# Patient Record
Sex: Male | Born: 1995 | Race: White | Hispanic: No | Marital: Single
Health system: Southern US, Community
[De-identification: ages and names within clinical notes are randomized; demographics above are authoritative.]

## PROBLEM LIST (undated history)

## (undated) DIAGNOSIS — B019 Varicella without complication: Secondary | ICD-10-CM

## (undated) DIAGNOSIS — M303 Mucocutaneous lymph node syndrome [Kawasaki]: Secondary | ICD-10-CM

## (undated) DIAGNOSIS — R55 Syncope and collapse: Secondary | ICD-10-CM

## (undated) HISTORY — DX: Mucocutaneous lymph node syndrome (kawasaki): M30.3

## (undated) HISTORY — DX: Syncope and collapse: R55

## (undated) HISTORY — DX: Varicella without complication: B01.9

---
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
## 2015-09-29 ENCOUNTER — Encounter: Payer: Self-pay | Admitting: Family Medicine

## 2015-09-29 ENCOUNTER — Ambulatory Visit (INDEPENDENT_AMBULATORY_CARE_PROVIDER_SITE_OTHER): Payer: 59 | Admitting: Family Medicine

## 2015-09-29 VITALS — BP 126/89 | HR 62 | Temp 98.4°F | Ht 71.5 in | Wt 138.5 lb

## 2015-09-29 DIAGNOSIS — Z0000 Encounter for general adult medical examination without abnormal findings: Secondary | ICD-10-CM | POA: Diagnosis not present

## 2015-09-29 NOTE — Patient Instructions (Signed)
Get flu vaccine when it is ready.

## 2015-09-29 NOTE — Progress Notes (Signed)
The patient is here for annual wellness exam and preventative care.    Going to [employer]. Living on campus. Changed to PA track instead pharmacy. Has some scholarships.  Diet; healthy. Exercise:  several times a week. Has been drinking protein shakes. Eats three meals a day, snacks occ. Body mass index is 19.05 kg/m.   No issues sleeping.   No concern about self image. No depression.   Social History /Family History/Past Medical History reviewed and updated if needed.   Review of Systems  Constitutional: Negative for fatigue.  HENT: Negative for ear pain.   Eyes: Negative for pain.  Respiratory: Negative for cough and shortness of breath.   Cardiovascular: Negative for chest pain and leg swelling.  Gastrointestinal: Negative for nausea, abdominal pain, diarrhea, constipation and blood in stool.  Genitourinary: Negative for dysuria, urgency, penile pain and testicular pain.  Musculoskeletal: Negative for back pain.  Psychiatric/Behavioral: Negative for dysphoric mood. The patient is not nervous/anxious.        Objective:   Physical Exam  Constitutional: He appears well-developed and well-nourished.  Non-toxic appearance. He does not appear ill. No distress.  HENT:  Head: Normocephalic and atraumatic.  Right Ear: Hearing, tympanic membrane, external ear and ear canal normal.  Left Ear: Hearing, tympanic membrane, external ear and ear canal normal.  Nose: Nose normal.  Mouth/Throat: Uvula is midline, oropharynx is clear and moist and mucous membranes are normal.  Eyes: Conjunctivae, EOM and lids are normal. Pupils are equal, round, and reactive to light. Lids are everted and swept, no foreign bodies found.  Neck: Trachea normal, normal range of motion and phonation normal. Neck supple. Carotid bruit is not present. No thyroid mass and no thyromegaly present.  Cardiovascular: Normal rate, regular rhythm, S1 normal, S2 normal, intact distal pulses and normal pulses.  Exam  reveals no gallop.   No murmur heard. Pulmonary/Chest: Breath sounds normal. He has no wheezes. He has no rhonchi. He has no rales.  Abdominal: Soft. Normal appearance and bowel sounds are normal. There is no hepatosplenomegaly. There is no tenderness. There is no rebound, no guarding and no CVA tenderness. No hernia. Hernia confirmed negative in the right inguinal area and confirmed negative in the left inguinal area.  Genitourinary: No evaluated.   He has no cervical adenopathy.       Right: No inguinal adenopathy present.       Left: No inguinal adenopathy present.  Neurological: He is alert. He has normal strength and normal reflexes. No cranial nerve deficit or sensory deficit. Gait normal.  Skin: Skin is warm, dry and intact. No rash noted.  Psychiatric: He has a normal mood and affect. His speech is normal and behavior is normal. Judgment normal.        Assessment & Plan:  The patient's preventative maintenance and recommended screening tests for an annual wellness exam were reviewed in full today. Brought up to date unless services declined.  Counselled on the importance of diet, exercise, and its role in overall health and mortality. The patient's FH and SH was reviewed, including their home life, tobacco status, and drug and alcohol status.   Vaccines: Meningitis vaccine 2013, uptodate with college vaccines.  Last TDap 2009, due for flu, will get flu when available. Nonsmoker Drugs : None ETOH: None Sex: has been active in past,  Not more than 5 partners in last. STD screen: No known exposure. He refuses screening.  Uses condom.

## 2015-09-29 NOTE — Progress Notes (Signed)
Pre visit review using our clinic review tool, if applicable. No additional management support is needed unless otherwise documented below in the visit note. 

## 2016-07-03 ENCOUNTER — Ambulatory Visit (INDEPENDENT_AMBULATORY_CARE_PROVIDER_SITE_OTHER): Payer: 59 | Admitting: Internal Medicine

## 2016-07-03 ENCOUNTER — Ambulatory Visit: Payer: 59 | Admitting: Family Medicine

## 2016-07-03 ENCOUNTER — Encounter: Payer: Self-pay | Admitting: Internal Medicine

## 2016-07-03 VITALS — BP 124/78 | HR 102 | Temp 101.6°F | Wt 132.8 lb

## 2016-07-03 DIAGNOSIS — J020 Streptococcal pharyngitis: Secondary | ICD-10-CM

## 2016-07-03 DIAGNOSIS — R509 Fever, unspecified: Secondary | ICD-10-CM

## 2016-07-03 DIAGNOSIS — J029 Acute pharyngitis, unspecified: Secondary | ICD-10-CM

## 2016-07-03 MED ORDER — AMOXICILLIN 500 MG PO CAPS: 500.0000 mg | ORAL_CAPSULE | Freq: Three times a day (TID) | ORAL | 0 refills | Status: DC

## 2016-07-03 NOTE — Patient Instructions (Signed)

## 2016-07-03 NOTE — Progress Notes (Signed)
Subjective:    Patient ID: Andrew Gregory, male    DOB: 07-Sep-1995, 21 y.o.   MRN: [id number]  HPI  Pt presents to the clinic today with c/o sore throat, fever, chills and body aches. He reports this started 3 days ago. He is having some difficulty swallowing. He has some mild ear pressure but denies pain or decreased hearing. He has run fevers up to 102.0. He has tried Thera Flu with minimal relief. He has no history of allergies. He has had sick contacts with similar symptoms.   Review of Systems      Past Medical History:  Diagnosis Date  . Chicken pox   . Fainting spell   . Kawasaki disease (HCC) 2000    No current outpatient prescriptions on file.   No current facility-administered medications for this visit.     No Known Allergies  Family History  Problem Relation Age of Onset  . Cancer Maternal Grandmother 21       stomach cancer  . Hypertension Maternal Grandfather   . Heart disease Paternal Grandmother 58       stent placed  . Hyperlipidemia Paternal Grandfather   . Hypertension Paternal Grandfather     Social History   Social History  . Marital status: Single    Spouse name: N/A  . Number of children: N/A  . Years of education: N/A   Occupational History  . Not on file.   Social History Main Topics  . Smoking status: Never Smoker  . Smokeless tobacco: Never Used  . Alcohol use No  . Drug use: No  . Sexual activity: Not Currently   Other Topics Concern  . Not on file   Social History Narrative   Daily exercise: wrestling, P90X, running   Diet: healthy     Constitutional: Pt reports fever, chills and body aches. Denies headache or abrupt weight changes.  HEENT: Pt reports sore throat. Denies eye pain, eye redness, ear pain, ringing in the ears, wax buildup, runny nose, nasal congestion, bloody noset. Respiratory: Denies difficulty breathing, shortness of breath, cough or sputum production.    No other specific complaints in a complete review  of systems (except as listed in HPI above).  Objective:   Physical Exam BP 124/78   Pulse (!) 102   Temp (!) 101.6 F (38.7 C) (Oral)   Wt 132 lb 12 oz (60.2 kg)   SpO2 97%   BMI 18.26 kg/m  Wt Readings from Last 3 Encounters:  07/03/16 132 lb 12 oz (60.2 kg)  09/29/15 138 lb 8 oz (62.8 kg) (23 %, Z= -0.75)*  09/23/14 132 lb 8 oz (60.1 kg) (18 %, Z= -0.91)*   * Growth percentiles are based on CDC 2-20 Years data.    General: Appears his stated age, ill appearing, in NAD. Skin: No rashes noted. HEENT: Head: normal shape and size, no sinus tenderness noted; Ears: Tm's gray and intact, normal light reflex;  Throat/Mouth: Teeth present, mucosa erythematous and moist, white patchy exudate noted on bilateral tonsillar pillars.  Neck:  Bilateral anterior cervical adenopathy noted. Cardiovascular: Tachycardic with normal rhythm. S1,S2 noted.   Pulmonary/Chest: Normal effort and positive vesicular breath sounds. No respiratory distress. No wheezes, rales or ronchi noted.    BMET No results found for: NA, K, CL, CO2, GLUCOSE, BUN, CREATININE, CALCIUM, GFRNONAA, GFRAA  Lipid Panel     Component Value Date/Time   CHOL 159 11/22/2011   TRIG 46 11/22/2011   HDL 65 11/22/2011  LDLCALC 85 11/22/2011    CBC No results found for: WBC, RBC, HGB, HCT, PLT, MCV, MCH, MCHC, RDW, LYMPHSABS, MONOABS, EOSABS, BASOSABS  Hgb A1C No results found for: HGBA1C           Assessment & Plan:   Strep Throat:  RST: negative Will send throat culture Salt water gargles as needed Ibuprofen as needed for pain and swelling eRx for Amoxil TID x 10 days  RTC as needed or if symptoms persist or worsen Akemi Overholser, NP

## 2016-07-03 NOTE — Addendum Note (Signed)
Addended by: Littie DeedsEVONTENNO, Tyqwan Pink Y on: 07/03/2016 03:00 PM   Modules accepted: Orders

## 2016-07-06 LAB — CULTURE, GROUP A STREP: Organism ID, Bacteria: NORMAL

## 2016-11-05 DIAGNOSIS — N342 Other urethritis: Secondary | ICD-10-CM | POA: Diagnosis not present

## 2016-11-26 DIAGNOSIS — Z23 Encounter for immunization: Secondary | ICD-10-CM | POA: Diagnosis not present

## 2018-07-24 ENCOUNTER — Encounter: Payer: Self-pay | Admitting: Family Medicine

## 2018-07-24 ENCOUNTER — Ambulatory Visit (INDEPENDENT_AMBULATORY_CARE_PROVIDER_SITE_OTHER): Payer: 59 | Admitting: Family Medicine

## 2018-07-24 ENCOUNTER — Telehealth: Payer: Self-pay | Admitting: General Practice

## 2018-07-24 DIAGNOSIS — Z20828 Contact with and (suspected) exposure to other viral communicable diseases: Secondary | ICD-10-CM | POA: Diagnosis not present

## 2018-07-24 DIAGNOSIS — Z20822 Contact with and (suspected) exposure to covid-19: Secondary | ICD-10-CM | POA: Insufficient documentation

## 2018-07-24 NOTE — Addendum Note (Signed)
Addended by: Dimple Nanas on: 07/24/2018 05:12 PM   Modules accepted: Orders

## 2018-07-24 NOTE — Telephone Encounter (Signed)
Called pt, lvm on mobile to return call to schedule Covid testing.

## 2018-07-24 NOTE — Assessment & Plan Note (Addendum)
Exposure to known positive pt, < 6 feet and no mask  Asymptomatic.. no indication for home monitoring at this time. Set up testing, home isolation, info sent to pt via MyChart on isolation, social distancing and management.

## 2018-07-24 NOTE — Progress Notes (Signed)
VIRTUAL VISIT Due to national recommendations of social distancing due to [place] 19, a virtual visit is felt to be most appropriate for this patient at this time.   I connected with the patient on 07/24/18 at  3:00 PM EDT by virtual telehealth platform and verified that I am speaking with the correct person using two identifiers.   I discussed the limitations, risks, security and privacy concerns of performing an evaluation and management service by  virtual telehealth platform and the availability of in person appointments. I also discussed with the patient that there may be a patient responsible charge related to this service. The patient expressed understanding and agreed to proceed.  Patient location: Home Provider Location: [place] Participants: Eliezer Lofts and [facility]   Chief Complaint  Patient presents with  . Covid [place]    History of Present Illness: 23 year old male presents for concerns of covid19 exposure   He reports he has been in contact with cousin without masks off and on in several weeks.  Not wearing a mask. Last around her 4 days ago  She has fever and has tested positive for covid 19 today.   No fever, no sob, cough, congestion, N/V/D, loss of taste and smell   COVID 19 screen No recent travel or known exposure to COVID19 The patient denies respiratory symptoms of COVID 19 at this time.  The importance of social distancing was discussed today.   Review of Systems  Constitutional: Negative for chills and fever.  HENT: Negative for congestion and ear pain.   Eyes: Negative for pain and redness.  Respiratory: Negative for cough and shortness of breath.   Cardiovascular: Negative for chest pain, palpitations and leg swelling.  Gastrointestinal: Negative for abdominal pain, blood in stool, constipation, diarrhea, nausea and vomiting.  Genitourinary: Negative for dysuria.  Musculoskeletal: Negative for falls and myalgias.  Skin: Negative for  rash.  Neurological: Negative for dizziness.  Psychiatric/Behavioral: Negative for depression. The patient is not nervous/anxious.       Past Medical History:  Diagnosis Date  . Chicken pox   . Fainting spell   . Kawasaki disease ([place]) 2000    reports that he has never smoked. He has never used smokeless tobacco. He reports that he does not drink alcohol or use drugs.  No current outpatient medications on file.   Observations/Objective: Height 6\' 1"  (1.854 m), weight 138 lb 6 oz (62.8 kg).  Physical Exam  Physical Exam Constitutional:      General: The patient is not in acute distress. Pulmonary:     Effort: Pulmonary effort is normal. No respiratory distress.  Neurological:     Mental Status: The patient is alert and oriented to person, place, and time.  Psychiatric:        Mood and Affect: Mood normal.        Behavior: Behavior normal.   Assessment and Plan   Exposure to Covid-19 Virus Exposure to known positive pt, < 6 feet and no mask  Asymptomatic.. no indication for home monitoring at this time. Set up testing, home isolation, info sent to pt via MyChart on isolation, social distancing and management.   I discussed the assessment and treatment plan with the patient. The patient was provided an opportunity to ask questions and all were answered. The patient agreed with the plan and demonstrated an understanding of the instructions.   The patient was advised to call back or seek an in-person evaluation if the symptoms worsen  or if the condition fails to improve as anticipated.     Kerby NoraAmy , MD

## 2018-07-24 NOTE — Telephone Encounter (Signed)
-----   Message from [name], MD sent at 07/24/2018  4:22 PM EDT ----- Andrew Gregory  DOB [birth year] MRN:  [id number]  Asymptomatic, < 6 feet, no mask close exposure to known covid 19 positive patient in last 4 days [place] [phone]

## 2018-07-24 NOTE — Patient Instructions (Signed)
How to care for yourself at home:   1) Drink plenty of fluids 2) Get lots of rest 3) Wash your hands regularly - for 20 seconds 4) Cover your mouth when you cough -- ideally cough into your elbow 5) Take tylenol - can do up to 1000 mg every 8 hours (or do smaller doses more frequently) - Avoid Ibuprofen if able 6) Stay home - avoid contact with other people. Ideally have someone bring your groceries, etc.  7) Avoid touching your eyes, nose, mouth 8) Over the counter cold medication may be helpful  You leave home again - when ALL the following are TRUE:  1) No fever for at least 72 hours (without taking any medication) 2) Other symptoms have improved - no longer with cough or shortness of breath 3) At least 7 days since you got sick  Call the clinic immediately or consider going to the emergency room if:  1) Trouble Breathing 2) Persistent pain or pressure in the chest 3) New confusion or inability to wake 4) Bluish lips or face 5) Notify them that you may have COVID-19  For close contacts (people in your home)  1) Help the person you are with stay home - grocery, pharmacy, etc 2) Help monitor their symptoms for worsening illness 3) Ideally sleep in a separate room and try to use separate bathroom if possible 4) Try to limit care giver to ONE person - all others (including pets) should avoid contact 5) Clean surfaces often and wash laundry often 6) Monitor yourself for symptoms. Make sure you contact your health care provider and avoid work as soon as you develop symptoms 7) Ideally would recommend working from home or not going to work if able.    Medications:  You may have heard about medications currently being used to treat COVID-19 - including Remdesivir and Hydroxychloroquine/Chloroquine. Currently there are no FDA approved medications to treat COVID-19 and these medicines are only being used as part of a clinical trials (still studying the effect). They are only being used in  hospitalized patients because they come with significant risks.   The only proven treatment is symptomatic care - rest, fluids, tylenol.    Below is more detailed information from the [place] [facility]  Individuals who are confirmed to have, or are being evaluated for, COVID-19 should follow the prevention steps below until a healthcare provider or local or state health department says they can return to normal activities.  Stay home except to get medical care You should restrict activities outside your home, except for getting medical care. Do not go to work, school, or public areas, and do not use public transportation or taxis.  Call ahead before visiting your doctor Before your medical appointment, call the healthcare provider and tell them that you are being evaluated for, COVID-19 infection.  Monitor your symptoms Seek prompt medical attention if your illness is worsening (e.g., difficulty breathing).   Wear a facemask You should wear a facemask that covers your nose and mouth when you are in the same room with other people and when you visit a healthcare provider.   Separate yourself from other people in your home As much as possible, you should stay in a different room from other people in your home. Also, you should use a separate bathroom, if available.  Avoid sharing household items You should not share dishes, drinking glasses, cups, eating utensils, towels, bedding, or other items with other people in your home. After using these   items, you should wash them thoroughly with soap and water.  Cover your coughs and sneezes Cover your mouth and nose with a tissue when you cough or sneeze, or you can cough or sneeze into your sleeve. Throw used tissues in a lined trash can, and immediately wash your hands with soap and water for at least 20 seconds or use an alcohol-based hand rub.  Wash your hands Wash your hands often and thoroughly with soap and water for at least  20 seconds. You can use an alcohol-based hand sanitizer if soap and water are not available and if your hands are not visibly dirty. Avoid touching your eyes, nose, and mouth with unwashed hands.   Prevention Steps for Caregivers and Household Members of Individuals Confirmed to have, or Being Evaluated for, COVID-19 Infection Being Cared for in the Home  If you live with, or provide care at home for, a person confirmed to have, or being evaluated for, COVID-19 infection please follow these guidelines to prevent infection:  Follow healthcare provider's instructions Make sure that you understand and can help the patient follow any healthcare provider instructions for all care.  Provide for the patient's basic needs You should help the patient with basic needs in the home and provide support for getting groceries, prescriptions, and other personal needs.  Monitor the patient's symptoms If they are getting sicker, call his or her medical provider.   Limit the number of people who have contact with the patient  If possible, have only one caregiver for the patient.  Other household members should stay in another home or place of residence. If this is not possible, they should stay  in another room, or be separated from the patient as much as possible. Use a separate bathroom, if available.  Restrict visitors who do not have an essential need to be in the home.  Keep older adults, very young children, and other sick people away from the patient Keep older adults, very young children, and those who have compromised immune systems or chronic health conditions away from the patient. This includes people with chronic heart, lung, or kidney conditions, diabetes, and cancer.  Ensure good ventilation Make sure that shared spaces in the home have good air flow, such as from an air conditioner or an opened window, weather permitting.  Wash your hands often  Wash your hands often and  thoroughly with soap and water for at least 20 seconds. You can use an alcohol based hand sanitizer if soap and water are not available and if your hands are not visibly dirty.  Avoid touching your eyes, nose, and mouth with unwashed hands.  Use disposable paper towels to dry your hands. If not available, use dedicated cloth towels and replace them when they become wet.  Wear a facemask and gloves  Wear a disposable facemask at all times in the room and gloves when you touch or have contact with the patient's blood, body fluids, and/or secretions or excretions, such as sweat, saliva, sputum, nasal mucus, vomit, urine, or feces.  Ensure the mask fits over your nose and mouth tightly, and do not touch it during use.  Throw out disposable facemasks and gloves after using them. Do not reuse.  Wash your hands immediately after removing your facemask and gloves.  If your personal clothing becomes contaminated, carefully remove clothing and launder. Wash your hands after handling contaminated clothing.  Place all used disposable facemasks, gloves, and other waste in a lined container before disposing them   with other household waste.  Remove gloves and wash your hands immediately after handling these items.  Do not share dishes, glasses, or other household items with the patient  Avoid sharing household items. You should not share dishes, drinking glasses, cups, eating utensils, towels, bedding, or other items with a patient who is confirmed to have, or being evaluated for, COVID-19 infection.  After the person uses these items, you should wash them thoroughly with soap and water.  Wash laundry thoroughly  Immediately remove and wash clothes or bedding that have blood, body fluids, and/or secretions or excretions, such as sweat, saliva, sputum, nasal mucus, vomit, urine, or feces, on them.  Wear gloves when handling laundry from the patient.  Read and follow directions on labels of laundry or  clothing items and detergent. In general, wash and dry with the warmest temperatures recommended on the label.  Clean all areas the individual has used often  Clean all touchable surfaces, such as counters, tabletops, doorknobs, bathroom fixtures, toilets, phones, keyboards, tablets, and bedside tables, every day. Also, clean any surfaces that may have blood, body fluids, and/or secretions or excretions on them.  Wear gloves when cleaning surfaces the patient has come in contact with.  Use a diluted bleach solution (e.g., dilute bleach with 1 part bleach and 10 parts water) or a household disinfectant with a label that says EPA-registered for coronaviruses. To make a bleach solution at home, add 1 tablespoon of bleach to 1 quart (4 cups) of water. For a larger supply, add  cup of bleach to 1 gallon (16 cups) of water.  Read labels of cleaning products and follow recommendations provided on product labels. Labels contain instructions for safe and effective use of the cleaning product including precautions you should take when applying the product, such as wearing gloves or eye protection and making sure you have good ventilation during use of the product.  Remove gloves and wash hands immediately after cleaning.  Monitor yourself for signs and symptoms of illness Caregivers and household members are considered close contacts, should monitor their health, and will be asked to limit movement outside of the home to the extent possible. Follow the monitoring steps for close contacts listed on the symptom monitoring form.   

## 2018-07-24 NOTE — Telephone Encounter (Signed)
Pt returned call.  Scheduled pt for Covid-19 testing.   Pt was referred by: [name], MD

## 2018-07-27 ENCOUNTER — Other Ambulatory Visit: Payer: 59

## 2018-07-27 DIAGNOSIS — Z20822 Contact with and (suspected) exposure to covid-19: Secondary | ICD-10-CM

## 2018-07-29 LAB — NOVEL CORONAVIRUS, NAA: SARS-CoV-2, NAA: NOT DETECTED

## 2018-08-08 ENCOUNTER — Telehealth: Payer: Self-pay

## 2018-08-08 NOTE — Telephone Encounter (Signed)
Received fax from [facility] that Andrew Gregory was having a fever and diarrhea.  I called and spoke to Andrew Gregory. He said he was at an [facility] in [place].

## 2018-08-10 NOTE — Telephone Encounter (Signed)
I spoke with pt and he did go to [facility] in [place]; pt was given Motrin and had covid testing; pt said he feels a little better and will cb for appt if needed.

## 2018-08-10 NOTE — Telephone Encounter (Signed)
[place] Night - Client TELEPHONE ADVICE RECORD [facility] Patient Name: Andrew Gregory Gender: Male DOB: 12-Dec-1995 Age: 23 Y 29 M 20 D Return Phone Number: [phone] (Primary), [phone] (Secondary) Address: City/State/Zip: [place] [address] Client [facility] Night - Client Client Site [place] Physician Eliezer Lofts - MD Contact Type Call Who Is Calling Patient / Member / Family / Caregiver Call Type Triage / Clinical Relationship To Patient Self Return Phone Number [phone] (Primary) Chief Complaint CHEST PAIN (>=21 years) - pain, pressure, heaviness or tightness Reason for Call Symptomatic / Request for [place] states he has a fever, chest pain, and diarrhea. Translation No Nurse Assessment Nurse: Laurann Montana, RN, [place]ow Date/Time Eilene Ghazi Time): 08/08/2018 9:22:56 AM Confirm and document reason for call. If symptomatic, describe symptoms. ---Caller states he checked temp this morning and it was 100.4 orally. Caller states that he doesn't have chest pain right now, but he has it off and on. Caller has had diarrhea since last night. No vomiting. No cough or SOB. Has the patient had close contact with a person known or suspected to have the novel coronavirus illness OR traveled / lives in area with major community spread (including international travel) in the last 14 days from the onset of symptoms? * If Asymptomatic, screen for exposure and travel within the last 14 days. ---No Does the patient have any new or worsening symptoms? ---Yes Will a triage be completed? ---Yes Related visit to physician within the last 2 weeks? ---No Does the PT have any chronic conditions? (i.e. diabetes, asthma, this includes High risk factors for pregnancy, etc.) ---No Is this a behavioral health or substance abuse call? ---No Guidelines Guideline Title Affirmed  Question Affirmed Notes Nurse Date/Time (Eastern Time) Chest Pain [1] Chest pain (or "angina") comes and goes AND [2] is happening more often (increasing in frequency) or getting worse (increasing in severity) Laurann Montana, RN, [place] Meadow 08/08/2018 9:24:41 AM PLEASE NOTE: All timestamps contained within this report are represented as [place] Standard Time. CONFIDENTIALTY NOTICE: This fax transmission is intended only for the addressee. It contains information that is legally privileged, confidential or otherwise protected from use or disclosure. If you are not the intended recipient, you are strictly prohibited from reviewing, disclosing, copying using or disseminating any of this information or taking any action in reliance on or regarding this information. If you have received this fax in error, please notify us immediately by telephone so that we can arrange for its return to [place]. Phone: [phone], Toll-Free: [phone], Fax: [phone] Page: 2 of 2 Call Id: [id number] [place]. Time Eilene Ghazi Time) Disposition Final User 08/08/2018 9:21:43 AM Send to Urgent Queue Merrilee Seashore 08/08/2018 9:28:49 AM Go to ED Now Yes Laurann Montana, RN, [place] Disagree/Comply [place] Understands Yes PreDisposition InappropriateToAsk Care Advice Given Per Guideline GO TO ED NOW: * You need to be seen in the Emergency Department. DRIVING: Another adult should drive. BRING MEDICINES: * Please bring a list of your current medicines when you go to the Emergency Department (ER). NOTHING BY MOUTH: Do not eat or drink anything for now. CALL EMS IF: * Severe difficulty breathing occurs * Passes out or becomes too weak to stand * You become worse. CARE ADVICE given per Chest Pain (Adult) guideline. Referrals GO TO FACILITY UNDECIDED

## 2019-04-18 ENCOUNTER — Emergency Department (HOSPITAL_COMMUNITY): Payer: 59

## 2019-04-18 ENCOUNTER — Observation Stay (HOSPITAL_COMMUNITY): Payer: 59

## 2019-04-18 ENCOUNTER — Encounter (HOSPITAL_COMMUNITY): Payer: Self-pay | Admitting: Emergency Medicine

## 2019-04-18 ENCOUNTER — Other Ambulatory Visit: Payer: Self-pay

## 2019-04-18 ENCOUNTER — Inpatient Hospital Stay (HOSPITAL_COMMUNITY)
Admission: EM | Admit: 2019-04-18 | Discharge: 2019-04-22 | DRG: 141 | Disposition: A | Discharge status: Home / Self Care | Payer: 59 | Attending: Plastic Surgery | Admitting: Plastic Surgery

## 2019-04-18 DIAGNOSIS — Z20822 Contact with and (suspected) exposure to covid-19: Secondary | ICD-10-CM | POA: Diagnosis present

## 2019-04-18 DIAGNOSIS — S0101XA Laceration without foreign body of scalp, initial encounter: Secondary | ICD-10-CM

## 2019-04-18 DIAGNOSIS — Z419 Encounter for procedure for purposes other than remedying health state, unspecified: Secondary | ICD-10-CM

## 2019-04-18 DIAGNOSIS — M25512 Pain in left shoulder: Secondary | ICD-10-CM

## 2019-04-18 DIAGNOSIS — S0266XB Fracture of symphysis of mandible, initial encounter for open fracture: Secondary | ICD-10-CM | POA: Diagnosis not present

## 2019-04-18 DIAGNOSIS — S0240CA Maxillary fracture, right side, initial encounter for closed fracture: Secondary | ICD-10-CM | POA: Diagnosis present

## 2019-04-18 DIAGNOSIS — S02609B Fracture of mandible, unspecified, initial encounter for open fracture: Secondary | ICD-10-CM

## 2019-04-18 DIAGNOSIS — T1490XA Injury, unspecified, initial encounter: Secondary | ICD-10-CM

## 2019-04-18 DIAGNOSIS — S0231XA Fracture of orbital floor, right side, initial encounter for closed fracture: Secondary | ICD-10-CM | POA: Diagnosis present

## 2019-04-18 DIAGNOSIS — S02401A Maxillary fracture, unspecified, initial encounter for closed fracture: Secondary | ICD-10-CM

## 2019-04-18 LAB — COMPREHENSIVE METABOLIC PANEL
ALT: 113 U/L — ABNORMAL HIGH (ref 0–44)
AST: 249 U/L — ABNORMAL HIGH (ref 15–41)
Albumin: 4.2 g/dL (ref 3.5–5.0)
Alkaline Phosphatase: 54 U/L (ref 38–126)
Anion gap: 15 (ref 5–15)
BUN: 12 mg/dL (ref 6–20)
CO2: 20 mmol/L — ABNORMAL LOW (ref 22–32)
Calcium: 8.3 mg/dL — ABNORMAL LOW (ref 8.9–10.3)
Chloride: 107 mmol/L (ref 98–111)
Creatinine, Ser: 0.83 mg/dL (ref 0.61–1.24)
GFR calc Af Amer: >60 mL/min (ref >60)
GFR calc non Af Amer: >60 mL/min (ref >60)
Glucose, Bld: 110 mg/dL — ABNORMAL HIGH (ref 70–99)
Potassium: 3.9 mmol/L (ref 3.5–5.1)
Sodium: 142 mmol/L (ref 135–145)
Total Bilirubin: 0.5 mg/dL (ref 0.3–1.2)
Total Protein: 7.1 g/dL (ref 6.5–8.1)

## 2019-04-18 LAB — URINALYSIS, ROUTINE W REFLEX MICROSCOPIC
Bilirubin Urine: NEGATIVE
Glucose, UA: NEGATIVE mg/dL
Hgb urine dipstick: NEGATIVE
Ketones, ur: 80 mg/dL — AB
Leukocytes,Ua: NEGATIVE
Nitrite: NEGATIVE
Protein, ur: NEGATIVE mg/dL
Specific Gravity, Urine: 1.026 (ref 1.005–1.030)
pH: 5 (ref 5.0–8.0)

## 2019-04-18 LAB — I-STAT CHEM 8, ED
BUN: 14 mg/dL (ref 6–20)
Calcium, Ion: 1 mmol/L — ABNORMAL LOW (ref 1.15–1.40)
Chloride: 108 mmol/L (ref 98–111)
Creatinine, Ser: 1 mg/dL (ref 0.61–1.24)
Glucose, Bld: 106 mg/dL — ABNORMAL HIGH (ref 70–99)
HCT: 43 % (ref 39.0–52.0)
Hemoglobin: 14.6 g/dL (ref 13.0–17.0)
Potassium: 3.9 mmol/L (ref 3.5–5.1)
Sodium: 143 mmol/L (ref 135–145)
TCO2: 22 mmol/L (ref 22–32)

## 2019-04-18 LAB — CBC
HCT: 43.9 % (ref 39.0–52.0)
Hemoglobin: 14.4 g/dL (ref 13.0–17.0)
MCH: 31.4 pg (ref 26.0–34.0)
MCHC: 32.8 g/dL (ref 30.0–36.0)
MCV: 95.9 fL (ref 80.0–100.0)
Platelets: 347 10*3/uL (ref 150–400)
RBC: 4.58 MIL/uL (ref 4.22–5.81)
RDW: 11.9 % (ref 11.5–15.5)
WBC: 18.2 10*3/uL — ABNORMAL HIGH (ref 4.0–10.5)
nRBC: 0 % (ref 0.0–0.2)

## 2019-04-18 LAB — ETHANOL: Alcohol, Ethyl (B): 163 mg/dL — ABNORMAL HIGH (ref <10)

## 2019-04-18 LAB — PROTIME-INR
INR: 1 (ref 0.8–1.2)
Prothrombin Time: 13.1 seconds (ref 11.4–15.2)

## 2019-04-18 LAB — HIV ANTIBODY (ROUTINE TESTING W REFLEX): HIV Screen 4th Generation wRfx: NONREACTIVE

## 2019-04-18 LAB — RESPIRATORY PANEL BY RT PCR (FLU A&B, COVID)
Influenza A by PCR: NEGATIVE
Influenza B by PCR: NEGATIVE
SARS Coronavirus 2 by RT PCR: NEGATIVE

## 2019-04-18 LAB — SAMPLE TO BLOOD BANK

## 2019-04-18 LAB — LACTIC ACID, PLASMA: Lactic Acid, Venous: 1.5 mmol/L (ref 0.5–1.9)

## 2019-04-18 MED ORDER — HYDROMORPHONE HCL 1 MG/ML IJ SOLN: 0.5000 mg | INTRAMUSCULAR | Status: DC | PRN

## 2019-04-18 MED ORDER — ACETAMINOPHEN 325 MG PO TABS
650.0000 mg | ORAL_TABLET | ORAL | Status: DC | PRN
  Administered 2019-04-20 (×2): 650 mg via ORAL
  Filled 2019-04-18 (×2): qty 2

## 2019-04-18 MED ORDER — OXYCODONE HCL 5 MG PO TABS: 5.0000 mg | ORAL_TABLET | ORAL | Status: DC | PRN

## 2019-04-18 MED ORDER — TETANUS-DIPHTH-ACELL PERTUSSIS 5-2.5-18.5 LF-MCG/0.5 IM SUSP
0.5000 mL | Freq: Once | INTRAMUSCULAR | Status: AC
  Administered 2019-04-18: 04:00:00 0.5 mL via INTRAMUSCULAR
  Filled 2019-04-18: qty 0.5

## 2019-04-18 MED ORDER — CLINDAMYCIN HCL 150 MG PO CAPS: 300.0000 mg | ORAL_CAPSULE | Freq: Four times a day (QID) | ORAL | 0 refills | Status: DC

## 2019-04-18 MED ORDER — CLINDAMYCIN HCL 300 MG PO CAPS: 300.0000 mg | ORAL_CAPSULE | Freq: Four times a day (QID) | ORAL | Status: DC

## 2019-04-18 MED ORDER — OXYCODONE-ACETAMINOPHEN 5-325 MG PO TABS: 1.0000 | ORAL_TABLET | ORAL | 0 refills | Status: DC | PRN

## 2019-04-18 MED ORDER — LIDOCAINE-EPINEPHRINE (PF) 2 %-1:200000 IJ SOLN
20.0000 mL | Freq: Once | INTRAMUSCULAR | Status: AC
  Administered 2019-04-18: 20 mL
  Filled 2019-04-18: qty 20

## 2019-04-18 MED ORDER — CLINDAMYCIN PHOSPHATE 300 MG/50ML IV SOLN
300.0000 mg | Freq: Three times a day (TID) | INTRAVENOUS | Status: DC
  Administered 2019-04-18 – 2019-04-22 (×11): 300 mg via INTRAVENOUS
  Filled 2019-04-18 (×13): qty 50

## 2019-04-18 MED ORDER — ONDANSETRON 4 MG PO TBDP: 4.0000 mg | ORAL_TABLET | Freq: Four times a day (QID) | ORAL | Status: DC | PRN

## 2019-04-18 MED ORDER — ONDANSETRON HCL 4 MG/2ML IJ SOLN: 4.0000 mg | Freq: Four times a day (QID) | INTRAMUSCULAR | Status: DC | PRN

## 2019-04-18 MED ORDER — CLINDAMYCIN PHOSPHATE 900 MG/50ML IV SOLN
900.0000 mg | Freq: Once | INTRAVENOUS | Status: AC
  Administered 2019-04-18: 900 mg via INTRAVENOUS
  Filled 2019-04-18: qty 50

## 2019-04-18 MED ORDER — METOPROLOL TARTRATE 5 MG/5ML IV SOLN: 5.0000 mg | Freq: Four times a day (QID) | INTRAVENOUS | Status: DC | PRN

## 2019-04-18 MED ORDER — PANTOPRAZOLE SODIUM 40 MG PO TBEC
40.0000 mg | DELAYED_RELEASE_TABLET | Freq: Every day | ORAL | Status: DC
  Administered 2019-04-20 – 2019-04-22 (×3): 40 mg via ORAL
  Filled 2019-04-18 (×3): qty 1

## 2019-04-18 MED ORDER — SODIUM CHLORIDE 0.9 % IV SOLN: INTRAVENOUS | Status: DC

## 2019-04-18 MED ORDER — PANTOPRAZOLE SODIUM 40 MG IV SOLR
40.0000 mg | Freq: Every day | INTRAVENOUS | Status: DC
  Administered 2019-04-19: 40 mg via INTRAVENOUS
  Filled 2019-04-18 (×2): qty 40

## 2019-04-18 MED ORDER — SODIUM CHLORIDE 0.9 % IV BOLUS
1000.0000 mL | Freq: Once | INTRAVENOUS | Status: AC
  Administered 2019-04-18: 1000 mL via INTRAVENOUS

## 2019-04-18 MED ORDER — DOCUSATE SODIUM 100 MG PO CAPS
100.0000 mg | ORAL_CAPSULE | Freq: Two times a day (BID) | ORAL | Status: DC
  Administered 2019-04-20 – 2019-04-22 (×4): 100 mg via ORAL
  Filled 2019-04-18 (×4): qty 1

## 2019-04-18 MED ORDER — MORPHINE SULFATE (PF) 2 MG/ML IV SOLN
2.0000 mg | INTRAVENOUS | Status: DC | PRN
  Administered 2019-04-18 – 2019-04-22 (×13): 2 mg via INTRAVENOUS
  Filled 2019-04-18 (×13): qty 1

## 2019-04-18 MED ORDER — IOHEXOL 300 MG/ML  SOLN
100.0000 mL | Freq: Once | INTRAMUSCULAR | Status: AC | PRN
  Administered 2019-04-18: 03:00:00 100 mL via INTRAVENOUS

## 2019-04-18 NOTE — ED Notes (Signed)
Patient's mother given CT disc as previously discussed with Dr. Blinda Leatherwood.

## 2019-04-18 NOTE — Progress Notes (Signed)
Medicated for pain. Calling Dr.Dillingham to see if pt can have liquids.

## 2019-04-18 NOTE — ED Notes (Signed)
Patient changed into paper scrubs, IVs removed and patient assisted to wheelchair with warm blankets placed around him. Pt alert, but, continues to drool bloody sputum.
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
## 2019-04-18 NOTE — Discharge Instructions (Addendum)
Follow-up with facial trauma and ophthalmology as outlined above.  You will also need your staples removed in 10 days.  You can see your primary doctor or go to urgent care.  Left Shoulder Surgery Dr. Ramond Marrow will be performing your [facility] reconstruction and repair on 04/26/19 at 7:00 AM at [facility].   - You will likely have to be present at admitting about 2 hours prior to scheduled surgery.    - Please do not eat or drink anything after midnight prior to surgery.  Stay in your sling until surgery.  - You will be retested for COVID due to protocols on 3/11 and need to quarantine at home until surgery or your surgery may be cancelled.    - Your shoulder surgery is outpatient and you will be discharged back home afterwards.    -Contact my office at [facility] at [phone] with questions regarding your shoulder surgery.   Clear Liquid Diet, Adult Please follow this diet through 3/15.  Please note your should not eat after 11:59PM on 3/14 A clear liquid diet is a diet that includes only liquids and semi-liquids that you can see through. You do not eat any food on this diet. Most people need to follow this diet for only a short period of time. You may need to follow a clear liquid diet if:  You develop a medical condition right before or after you have surgery.  You were not able to eat food for a long period of time.  You had a condition that gave you nausea, vomiting, or diarrhea.  You are going to have a procedure or exam to look at parts of your digestive system.  You are going to have bowel surgery. The usual goals of this diet are:  To rest the stomach and digestive system as much as possible.  To help you clear the digestive system before a procedure or exam.  To keep you hydrated.  To make sure you get some calories for energy.  To help you return to your normal eating routine. What are tips for following this plan?  A clear liquid is a liquid  or semi-liquid, such as gelatin, that you can see through when you hold it up to a light.  A clear liquid diet does not provide all the nutrients that you need. It is important to choose a variety of the liquids or semi-liquids that are allowed on this diet. That way, you will get as many nutrients as possible.  If you are not sure whether you can have certain items, ask your health care provider. If you have difficulty swallowing thin liquids, you will need to thicken them to prevent breathing in (aspiration). What foods should I eat?   Water and flavored water.  Fruit juices that do not have pulp, such as cranberry juice and apple juice.  Tea and coffee without milk or cream.  Clear bouillon or broth.  Broth-based soups that have been strained.  Flavored gelatin.  Honey.  Sugar water.  Ice or frozen ice pops that do not contain milk, yogurt, fruit pieces, or fruit pulp.  Clear sodas.  Clear sports drinks. The items listed above may not be a complete list of recommended foods and beverages. Contact a dietitian for more information. What food should I avoid?  Juices that have pulp.  Milk.  Cream or cream-based soups.  Yogurt.  Regular foods that are not clear liquids or semi-liquids. The items listed above may not be  a complete list of foods and beverages to avoid. Contact a dietitian for more information. Questions to ask your health care provider:  How long do I need to follow this diet?  Are there any medicines that I should change while on this diet? Summary  A clear liquid diet is a diet that includes liquids and semi-liquids that you can see through.  The goal of this diet is to help you recover by resting your digestive system, keeping you hydrated, and providing nutrients.  Avoid liquids with milk, cream, or pulp while on this diet. This information is not intended to replace advice given to you by your health care provider. Make sure you discuss any  questions you have with your health care provider. Document Revised: 07/21/2017 Document Reviewed: 07/21/2017 Elsevier Patient Education  2020 [employer]   Full Liquid Diet You can start this diet AFTER your procedure on 3/15 A full liquid diet refers to fluids and foods that are liquid or will become liquid at room temperature. This diet should only be used for a short period of time to help you recover from illness or surgery. Your health care provider or dietitian will help you determine when it is safe to eat regular foods. What are tips for following this plan?     Reading food labels  Check food labels of nutrition shakes for the amount of protein. Look for nutrition shakes that have at least 8-10 grams of protein in each serving.  Look for drinks, such as milks and juices, that are "fortified" or "enriched." This means that vitamins and minerals have been added. Shopping  Buy premade nutritional shakes to keep on hand.  To vary your choices, buy different flavors of milks and shakes. Meal planning  Choose flavors and foods that you enjoy.  To make sure you get enough energy from food (calories): ? Eat 3 full liquid meals each day. Have a liquid snack between each meal. ? Drink 6-8 ounces (177-237 ml) of a nutrition supplement shake with meals or as snacks. ? Add protein powder, powdered milk, milk, or yogurt to shakes to increase the amount of protein.  Drink at least one serving a day of citrus fruit juice or fruit juice that has vitamin C added. General guidelines  Before starting the full-liquid diet, check with your health care provider to know what foods you should avoid. These may include full-fat or high-fiber liquids.  You may have any liquid or food that becomes a liquid at room temperature. The food is considered a liquid if it can be poured off a spoon at room temperature.  Do not drink alcohol unless approved by your health care provider.  This diet  gives you most of the nutrients that you need for energy, but you may not get enough of certain vitamins, minerals, and fiber. Make sure to talk to your health care provider or dietitian about: ? How many calories you need to eat get day. ? How much fluid you should have each day. ? Taking a multivitamin or a nutritional supplement. What foods are allowed? The items listed may not be a complete list. Talk with your dietitian about what dietary choices are best for you. Grains Thin hot cereal, such as cream of wheat. Soft-cooked pasta or rice pured in soup. Vegetables Pulp-free tomato or vegetable juice. Vegetables pured in soup. Fruits Fruit juice without pulp. Strained fruit pures (seeds and skins removed). Meats and other protein foods Beef, chicken, and fish broths. Powdered  protein supplements. Dairy Milk and milk-based beverages, including milk shakes and instant breakfast mixes. Smooth yogurt. Pured cottage cheese. Beverages Water. Coffee and tea (caffeinated or decaffeinated). Cocoa. Liquid nutritional supplements. Soft drinks. Nondairy milks, such as almond, coconut, rice, or soy milk. Fats and oils Melted margarine and butter. Cream. Canola, almond, avocado, corn, grapeseed, sunflower, and sesame oils. Gravy. Sweets and desserts Custard. Pudding. Flavored gelatin. Smooth ice cream (without nuts or candy pieces). Sherbet. Popsicles. [place] ice. Pudding pops. Seasoning and other foods Salt and pepper. Spices. Cocoa powder. Vinegar. Ketchup. Yellow mustard. Smooth sauces, such as [place], cheese sauce, or white sauce. Soy sauce. Cream soups. Strained soups. Syrup. Honey. Jelly (without fruit pieces). What foods are not allowed? The items listed may not be a complete list. Talk with your dietitian about what dietary choices are best for you. Grains Whole grains. Pasta. Rice. Cold cereal. Bread. Crackers. Vegetables All whole fresh, frozen, or canned vegetables. Fruits All  whole fresh, frozen, or canned fruits. Meats and other protein foods All cuts of meat, poultry, and fish. Eggs. Tofu and soy protein. Nuts and nut butters. Lunch meat. Sausage. Dairy Hard cheese. Yogurt with fruit chunks. Fats and oils Coconut oil. Palm oil. Lard. Cold butter. Sweets and desserts Ice cream or other frozen desserts that have any solids in them or on top, such as nuts, chocolate chips, and pieces of cookies. Cakes. Cookies. Candy. Seasoning and other foods Stone-ground mustards. Soups with chunks or pieces. Summary  A full liquid diet refers to fluids and foods that are liquid or will become liquid at room temperature.  This diet should only be used for a short period of time to help you recover from illness or surgery. Ask your health care provider or dietitian when it is safe for you to eat regular foods.  To make sure you get enough calories and nutrients, eat 3 meals each day with snacks between. Drink premade nutrition supplement shakes or add protein powder to homemade shakes. Take a vitamin and mineral supplement as told by your health care provider. This information is not intended to replace advice given to you by your health care provider. Make sure you discuss any questions you have with your health care provider. Document Revised: 04/26/2017 Document Reviewed: 03/13/2016 Elsevier Patient Education  2020 [employer].   How To Use a Sling Stay in your sling unit the time of surgery  A sling is a type of hanging bandage that is worn around the neck to protect an injured arm, shoulder, or other body part. A sling may be needed to prevent movement of (to immobilize) the injured body part while it heals. Keeping the injured body part still can lessen pain and speed up healing. A health care provider may recommend using a sling:  To treat a broken arm or collarbone.  To treat a shoulder injury.  After surgery. What are the risks? In general, wearing a sling  helps with safe healing. However, in some cases, wearing a sling the wrong way can:  Make the injury worse.  Cause stiffness or numbness.  Affect blood flow (circulation) in the arm and hand. This can cause tingling or numbness in the fingers or hands. How to use a sling Follow instructions from your health care provider about how and when to wear your sling. Your health care provider will show you or tell you:  How to put on the sling.  How to adjust the sling.  When and how often to wear  the sling.  How to remove the sling. The way that you need to use a sling depends on your injury. Unless your health care provider gives you different instructions, you should:  Wear the sling so that your elbow bends to the shape of a capital letter "L" (90 degrees, or a right angle).  Make sure the sling supports your wrist and your hand.  Adjust the sling if your fingers or hand start to tingle or feel numb. Follow these instructions at home:  Try to avoid moving your arm.  Do not twist, lift, or move your arm in a way that could make your injury worse.  Do not lean on your arm while wearing a sling.  Do not lift anything with the hand or arm that is in the sling. Contact a health care provider if:  You have bruising, swelling, or pain that gets worse.  You have pain that does not get better with medicine.  You have a fever.  Your sling is not supporting your arm properly.  Your sling gets damaged. Get help right away if you:  Have numbness or tingling in your fingers.  Notice that your fingers turn blue or feel cold to the touch.  Cannot control bleeding from your injury.  Have shortness of breath. Summary  A sling is a type of hanging bandage that is worn around the neck to protect an injured arm, shoulder, or other body part. A sling may be needed to prevent movement of (to immobilize) the injured body part while it heals.  The way that you use a sling depends on your  injury. Carefully follow instructions from your health care provider.  A good general rule is to wear the sling so that your arms bends 90 degrees (at a right angle) at the elbow. That is like the shape of a capital letter "L."  Make sure you know which problems should cause you to contact your health care provider or get help right away. This information is not intended to replace advice given to you by your health care provider. Make sure you discuss any questions you have with your health care provider. Document Revised: 01/10/2017 Document Reviewed: 12/19/2016 Elsevier Patient Education  2020 [employer].

## 2019-04-18 NOTE — Progress Notes (Signed)
Mom states he is not on any meds at home.
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
## 2019-04-18 NOTE — ED Notes (Signed)
Condom cath placed on patient

## 2019-04-18 NOTE — Progress Notes (Signed)
Texted Dr. Derrell Lolling to ask about CL diet since no OR until Wed.

## 2019-04-18 NOTE — Progress Notes (Signed)
Pt is more comfortable, HOB up 45 degrees.  Family at bedside to assist with any communication needs since pt is having a difficult time talking.  SCDs on and operating.  Taking some small amounts of clear liquids.
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
## 2019-04-18 NOTE — ED Notes (Addendum)
Upon attempting to discharge patient, patient's parents stated they did not feel comfortable taking patient home. Dr. Blinda Leatherwood made aware. Patient brought back to treatment room and placed back on monitoring equipment at advisement of EDP. Family advised we would update them with more information once plan of care was decided.
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
## 2019-04-18 NOTE — ED Notes (Signed)
Patient continues to drool blood sputum, sputum suctioned.
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
## 2019-04-18 NOTE — ED Notes (Signed)
Known wounds are multiple teeth shifting, gum laceration that is bleeding, left shoulder bruise, and laceration on nose. Pt states he is currently not in any pain at the moment
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
## 2019-04-19 ENCOUNTER — Telehealth: Payer: Self-pay | Admitting: Plastic Surgery

## 2019-04-19 DIAGNOSIS — S0266XB Fracture of symphysis of mandible, initial encounter for open fracture: Secondary | ICD-10-CM | POA: Diagnosis present

## 2019-04-19 DIAGNOSIS — T1490XA Injury, unspecified, initial encounter: Secondary | ICD-10-CM | POA: Diagnosis present

## 2019-04-19 DIAGNOSIS — Z20822 Contact with and (suspected) exposure to covid-19: Secondary | ICD-10-CM | POA: Diagnosis present

## 2019-04-19 DIAGNOSIS — S0240CA Maxillary fracture, right side, initial encounter for closed fracture: Secondary | ICD-10-CM | POA: Diagnosis present

## 2019-04-19 DIAGNOSIS — S0101XA Laceration without foreign body of scalp, initial encounter: Secondary | ICD-10-CM | POA: Diagnosis present

## 2019-04-19 DIAGNOSIS — S0231XA Fracture of orbital floor, right side, initial encounter for closed fracture: Secondary | ICD-10-CM | POA: Diagnosis present

## 2019-04-19 LAB — BASIC METABOLIC PANEL
Anion gap: 7 (ref 5–15)
BUN: 14 mg/dL (ref 6–20)
CO2: 25 mmol/L (ref 22–32)
Calcium: 8.3 mg/dL — ABNORMAL LOW (ref 8.9–10.3)
Chloride: 104 mmol/L (ref 98–111)
Creatinine, Ser: 0.78 mg/dL (ref 0.61–1.24)
GFR calc Af Amer: >60 mL/min (ref >60)
GFR calc non Af Amer: >60 mL/min (ref >60)
Glucose, Bld: 107 mg/dL — ABNORMAL HIGH (ref 70–99)
Potassium: 3.8 mmol/L (ref 3.5–5.1)
Sodium: 136 mmol/L (ref 135–145)

## 2019-04-19 LAB — CBC
HCT: 34.4 % — ABNORMAL LOW (ref 39.0–52.0)
Hemoglobin: 11.6 g/dL — ABNORMAL LOW (ref 13.0–17.0)
MCH: 31.8 pg (ref 26.0–34.0)
MCHC: 33.7 g/dL (ref 30.0–36.0)
MCV: 94.2 fL (ref 80.0–100.0)
Platelets: 267 10*3/uL (ref 150–400)
RBC: 3.65 MIL/uL — ABNORMAL LOW (ref 4.22–5.81)
RDW: 11.9 % (ref 11.5–15.5)
WBC: 11.5 10*3/uL — ABNORMAL HIGH (ref 4.0–10.5)
nRBC: 0 % (ref 0.0–0.2)

## 2019-04-19 MED ORDER — ENOXAPARIN SODIUM 40 MG/0.4ML ~~LOC~~ SOLN
40.0000 mg | Freq: Once | SUBCUTANEOUS | Status: AC
  Administered 2019-04-19: 40 mg via SUBCUTANEOUS
  Filled 2019-04-19 (×2): qty 0.4

## 2019-04-19 MED ORDER — OXYCODONE HCL 5 MG PO TABS
5.0000 mg | ORAL_TABLET | ORAL | Status: DC | PRN
  Administered 2019-04-19 (×2): 10 mg via ORAL
  Administered 2019-04-20: 13:00:00 5 mg via ORAL
  Administered 2019-04-20: 04:00:00 10 mg via ORAL
  Administered 2019-04-20: 5 mg via ORAL
  Administered 2019-04-21: 10 mg via ORAL
  Administered 2019-04-22: 5 mg via ORAL
  Filled 2019-04-19 (×4): qty 2
  Filled 2019-04-19: qty 1
  Filled 2019-04-19: qty 2
  Filled 2019-04-19: qty 1

## 2019-04-19 NOTE — Telephone Encounter (Signed)
Father, Orvilla Fus, called to speak Matt. He said that his son was seen this morning by them and he was told to call if he had questions which he did. Please call patient or dad to discuss
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
## 2019-04-19 NOTE — Progress Notes (Signed)
Orthopedic Tech Progress Note Patient Details:  Andrew Gregory 12-May-1995 [id number]  Ortho Devices Type of Ortho Device: Arm sling Ortho Device/Splint Location: delivered to RN Ortho Device/Splint Interventions: Ordered, Application, Adjustment   Post Interventions Patient Tolerated: Well Instructions Provided: Care of device, Adjustment of device   Trinna Post 04/19/2019, 6:15 AM
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
## 2019-04-19 NOTE — Progress Notes (Signed)
Orthopedic Tech Progress Note Patient Details:  Andrew Gregory 08-15-1995 [id number] Applied shoulder sling to patient this morning. Patient ID: Andrew Gregory, male   DOB: 1995/06/05, 24 y.o.   MRN: [id number]   [name] 04/19/2019, 11:10 AM
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
## 2019-04-20 ENCOUNTER — Inpatient Hospital Stay (HOSPITAL_COMMUNITY): Payer: 59

## 2019-04-20 LAB — SURGICAL PCR SCREEN
MRSA, PCR: NEGATIVE
Staphylococcus aureus: POSITIVE — AB

## 2019-04-20 MED ORDER — CEFAZOLIN SODIUM-DEXTROSE 2-4 GM/100ML-% IV SOLN
2.0000 g | INTRAVENOUS | Status: AC
  Administered 2019-04-21: 15:00:00 2 g via INTRAVENOUS
  Filled 2019-04-20: qty 100

## 2019-04-20 MED ORDER — CHLORHEXIDINE GLUCONATE CLOTH 2 % EX PADS: 6.0000 | MEDICATED_PAD | Freq: Once | CUTANEOUS | Status: AC

## 2019-04-20 MED ORDER — CHLORHEXIDINE GLUCONATE CLOTH 2 % EX PADS
6.0000 | MEDICATED_PAD | Freq: Once | CUTANEOUS | Status: AC
  Administered 2019-04-20: 6 via TOPICAL
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
## 2019-04-21 ENCOUNTER — Encounter (HOSPITAL_COMMUNITY): Payer: Self-pay

## 2019-04-21 ENCOUNTER — Encounter (HOSPITAL_COMMUNITY): Admission: EM | Disposition: A | Discharge status: Home / Self Care | Payer: Self-pay | Source: Home / Self Care

## 2019-04-21 ENCOUNTER — Inpatient Hospital Stay (HOSPITAL_COMMUNITY): Payer: 59 | Admitting: Anesthesiology

## 2019-04-21 ENCOUNTER — Inpatient Hospital Stay (HOSPITAL_COMMUNITY): Payer: 59

## 2019-04-21 DIAGNOSIS — T1490XA Injury, unspecified, initial encounter: Secondary | ICD-10-CM

## 2019-04-21 DIAGNOSIS — S0266XB Fracture of symphysis of mandible, initial encounter for open fracture: Secondary | ICD-10-CM

## 2019-04-21 HISTORY — PX: ORIF MANDIBULAR FRACTURE: SHX2127

## 2019-04-21 LAB — BASIC METABOLIC PANEL
Anion gap: 8 (ref 5–15)
BUN: 6 mg/dL (ref 6–20)
CO2: 26 mmol/L (ref 22–32)
Calcium: 8.7 mg/dL — ABNORMAL LOW (ref 8.9–10.3)
Chloride: 105 mmol/L (ref 98–111)
Creatinine, Ser: 0.74 mg/dL (ref 0.61–1.24)
GFR calc Af Amer: >60 mL/min (ref >60)
GFR calc non Af Amer: >60 mL/min (ref >60)
Glucose, Bld: 98 mg/dL (ref 70–99)
Potassium: 4 mmol/L (ref 3.5–5.1)
Sodium: 139 mmol/L (ref 135–145)

## 2019-04-21 LAB — CBC
HCT: 30.5 % — ABNORMAL LOW (ref 39.0–52.0)
Hemoglobin: 10.5 g/dL — ABNORMAL LOW (ref 13.0–17.0)
MCH: 32 pg (ref 26.0–34.0)
MCHC: 34.4 g/dL (ref 30.0–36.0)
MCV: 93 fL (ref 80.0–100.0)
Platelets: 230 10*3/uL (ref 150–400)
RBC: 3.28 MIL/uL — ABNORMAL LOW (ref 4.22–5.81)
RDW: 11.5 % (ref 11.5–15.5)
WBC: 6.7 10*3/uL (ref 4.0–10.5)
nRBC: 0 % (ref 0.0–0.2)

## 2019-04-21 SURGERY — OPEN REDUCTION INTERNAL FIXATION (ORIF) MANDIBULAR FRACTURE
Anesthesia: General

## 2019-04-21 MED ORDER — LIDOCAINE 2% (20 MG/ML) 5 ML SYRINGE
INTRAMUSCULAR | Status: DC | PRN
  Administered 2019-04-21: 60 mg via INTRAVENOUS

## 2019-04-21 MED ORDER — LABETALOL HCL 5 MG/ML IV SOLN
INTRAVENOUS | Status: AC
  Filled 2019-04-21: qty 4

## 2019-04-21 MED ORDER — MIDAZOLAM HCL 2 MG/2ML IJ SOLN
INTRAMUSCULAR | Status: DC | PRN
  Administered 2019-04-21: 2 mg via INTRAVENOUS

## 2019-04-21 MED ORDER — OXYMETAZOLINE HCL 0.05 % NA SOLN
NASAL | Status: DC | PRN
  Administered 2019-04-21 (×2): 2 via NASAL

## 2019-04-21 MED ORDER — LIDOCAINE 2% (20 MG/ML) 5 ML SYRINGE
INTRAMUSCULAR | Status: AC
  Filled 2019-04-21: qty 5

## 2019-04-21 MED ORDER — OXYMETAZOLINE HCL 0.05 % NA SOLN
NASAL | Status: AC
  Filled 2019-04-21: qty 30

## 2019-04-21 MED ORDER — DEXMEDETOMIDINE HCL IN NACL 200 MCG/50ML IV SOLN
INTRAVENOUS | Status: DC | PRN
  Administered 2019-04-21 (×2): 8 ug via INTRAVENOUS

## 2019-04-21 MED ORDER — OXYCODONE HCL 5 MG PO TABS: 5.0000 mg | ORAL_TABLET | Freq: Once | ORAL | Status: DC | PRN

## 2019-04-21 MED ORDER — HYDROMORPHONE HCL 1 MG/ML IJ SOLN
INTRAMUSCULAR | Status: AC
  Filled 2019-04-21: qty 1

## 2019-04-21 MED ORDER — KETAMINE HCL 50 MG/5ML IJ SOSY
PREFILLED_SYRINGE | INTRAMUSCULAR | Status: AC
  Filled 2019-04-21: qty 5

## 2019-04-21 MED ORDER — MUPIROCIN 2 % EX OINT
1.0000 "application " | TOPICAL_OINTMENT | Freq: Two times a day (BID) | CUTANEOUS | Status: DC
  Administered 2019-04-21 – 2019-04-22 (×3): 1 via NASAL
  Filled 2019-04-21: qty 22

## 2019-04-21 MED ORDER — ROCURONIUM BROMIDE 10 MG/ML (PF) SYRINGE
PREFILLED_SYRINGE | INTRAVENOUS | Status: DC | PRN
  Administered 2019-04-21: 10 mg via INTRAVENOUS
  Administered 2019-04-21 (×2): 20 mg via INTRAVENOUS
  Administered 2019-04-21: 10 mg via INTRAVENOUS
  Administered 2019-04-21: 70 mg via INTRAVENOUS

## 2019-04-21 MED ORDER — LABETALOL HCL 5 MG/ML IV SOLN
INTRAVENOUS | Status: DC | PRN
  Administered 2019-04-21: 2.5 mg via INTRAVENOUS

## 2019-04-21 MED ORDER — PROPOFOL 10 MG/ML IV BOLUS
INTRAVENOUS | Status: AC
  Filled 2019-04-21: qty 20

## 2019-04-21 MED ORDER — SUCCINYLCHOLINE CHLORIDE 20 MG/ML IJ SOLN
INTRAMUSCULAR | Status: DC | PRN
  Administered 2019-04-21: 160 mg via INTRAVENOUS

## 2019-04-21 MED ORDER — LACTATED RINGERS IV SOLN: INTRAVENOUS | Status: DC

## 2019-04-21 MED ORDER — MIDAZOLAM HCL 2 MG/2ML IJ SOLN
INTRAMUSCULAR | Status: AC
  Filled 2019-04-21: qty 2

## 2019-04-21 MED ORDER — STERILE WATER FOR IRRIGATION IR SOLN
Status: DC | PRN
  Administered 2019-04-21: 1000 mL

## 2019-04-21 MED ORDER — OXYCODONE HCL 5 MG/5ML PO SOLN: 5.0000 mg | Freq: Once | ORAL | Status: DC | PRN

## 2019-04-21 MED ORDER — DEXMEDETOMIDINE HCL IN NACL 200 MCG/50ML IV SOLN
INTRAVENOUS | Status: AC
  Filled 2019-04-21: qty 100

## 2019-04-21 MED ORDER — FENTANYL CITRATE (PF) 250 MCG/5ML IJ SOLN
INTRAMUSCULAR | Status: AC
  Filled 2019-04-21: qty 5

## 2019-04-21 MED ORDER — KETOROLAC TROMETHAMINE 30 MG/ML IJ SOLN
INTRAMUSCULAR | Status: AC
  Filled 2019-04-21: qty 1

## 2019-04-21 MED ORDER — SUCCINYLCHOLINE CHLORIDE 200 MG/10ML IV SOSY
PREFILLED_SYRINGE | INTRAVENOUS | Status: AC
  Filled 2019-04-21: qty 10

## 2019-04-21 MED ORDER — PROMETHAZINE HCL 25 MG/ML IJ SOLN: 6.2500 mg | INTRAMUSCULAR | Status: DC | PRN

## 2019-04-21 MED ORDER — KETAMINE HCL 10 MG/ML IJ SOLN
INTRAMUSCULAR | Status: DC | PRN
  Administered 2019-04-21: 30 mg via INTRAVENOUS

## 2019-04-21 MED ORDER — ROCURONIUM BROMIDE 10 MG/ML (PF) SYRINGE
PREFILLED_SYRINGE | INTRAVENOUS | Status: AC
  Filled 2019-04-21: qty 10

## 2019-04-21 MED ORDER — FENTANYL CITRATE (PF) 100 MCG/2ML IJ SOLN
INTRAMUSCULAR | Status: DC | PRN
  Administered 2019-04-21 (×2): 100 ug via INTRAVENOUS
  Administered 2019-04-21 (×5): 50 ug via INTRAVENOUS

## 2019-04-21 MED ORDER — KETOROLAC TROMETHAMINE 30 MG/ML IJ SOLN
30.0000 mg | Freq: Once | INTRAMUSCULAR | Status: AC | PRN
  Administered 2019-04-21: 20:00:00 30 mg via INTRAVENOUS

## 2019-04-21 MED ORDER — LIDOCAINE-EPINEPHRINE 1 %-1:100000 IJ SOLN
INTRAMUSCULAR | Status: AC
  Filled 2019-04-21: qty 1

## 2019-04-21 MED ORDER — ALBUMIN HUMAN 5 % IV SOLN: INTRAVENOUS | Status: DC | PRN

## 2019-04-21 MED ORDER — PROPOFOL 10 MG/ML IV BOLUS
INTRAVENOUS | Status: DC | PRN
  Administered 2019-04-21: 180 mg via INTRAVENOUS

## 2019-04-21 MED ORDER — SUGAMMADEX SODIUM 200 MG/2ML IV SOLN
INTRAVENOUS | Status: DC | PRN
  Administered 2019-04-21: 129.8 mg via INTRAVENOUS

## 2019-04-21 MED ORDER — ACETAMINOPHEN 500 MG PO TABS
1000.0000 mg | ORAL_TABLET | Freq: Once | ORAL | Status: AC
  Administered 2019-04-21: 1000 mg via ORAL
  Filled 2019-04-21: qty 2

## 2019-04-21 MED ORDER — MEPERIDINE HCL 25 MG/ML IJ SOLN: 6.2500 mg | INTRAMUSCULAR | Status: DC | PRN

## 2019-04-21 MED ORDER — DEXAMETHASONE SODIUM PHOSPHATE 10 MG/ML IJ SOLN
INTRAMUSCULAR | Status: AC
  Filled 2019-04-21: qty 1

## 2019-04-21 MED ORDER — 0.9 % SODIUM CHLORIDE (POUR BTL) OPTIME
TOPICAL | Status: DC | PRN
  Administered 2019-04-21: 1000 mL

## 2019-04-21 MED ORDER — ONDANSETRON HCL 4 MG/2ML IJ SOLN
INTRAMUSCULAR | Status: DC | PRN
  Administered 2019-04-21: 4 mg via INTRAVENOUS

## 2019-04-21 MED ORDER — DEXAMETHASONE SODIUM PHOSPHATE 10 MG/ML IJ SOLN
INTRAMUSCULAR | Status: DC | PRN
  Administered 2019-04-21: 10 mg via INTRAVENOUS

## 2019-04-21 MED ORDER — CHLORHEXIDINE GLUCONATE CLOTH 2 % EX PADS
6.0000 | MEDICATED_PAD | Freq: Every day | CUTANEOUS | Status: DC
  Administered 2019-04-21 – 2019-04-22 (×2): 6 via TOPICAL

## 2019-04-21 MED ORDER — LIDOCAINE-EPINEPHRINE 1 %-1:100000 IJ SOLN
INTRAMUSCULAR | Status: DC | PRN
  Administered 2019-04-21: 6 mL

## 2019-04-21 MED ORDER — ONDANSETRON HCL 4 MG/2ML IJ SOLN
INTRAMUSCULAR | Status: AC
  Filled 2019-04-21: qty 2

## 2019-04-21 MED ORDER — HYDROMORPHONE HCL 1 MG/ML IJ SOLN
0.2500 mg | INTRAMUSCULAR | Status: DC | PRN
  Administered 2019-04-21: 0.25 mg via INTRAVENOUS

## 2019-04-21 SURGICAL SUPPLY — 60 items
BAR FIX PREFORMED OMNIMAX (Miscellaneous) ×4 IMPLANT
BIT DRILL 1.6X115 (BIT) ×2
BIT DRILL 1.6X115MM (BIT) IMPLANT
BIT DRILL 1.6X50 (BIT) ×2 IMPLANT
BLADE CLIPPER SURG (BLADE) IMPLANT
BLADE SURG 15 STRL LF DISP TIS (BLADE) ×1 IMPLANT
BLADE SURG 15 STRL SS (BLADE) ×3
CANISTER SUCT 3000ML PPV (MISCELLANEOUS) ×3 IMPLANT
CLEANER TIP ELECTROSURG 2X2 (MISCELLANEOUS) ×3 IMPLANT
COVER SURGICAL LIGHT HANDLE (MISCELLANEOUS) ×3 IMPLANT
COVER WAND RF STERILE (DRAPES) ×1 IMPLANT
DRILL BIT 1.6X115MM (BIT) ×6
DRILL TW 1.1X50 15MM STP W/NT (MISCELLANEOUS) IMPLANT
ELECT COATED BLADE 2.86 ST (ELECTRODE) ×3 IMPLANT
ELECT REM PT RETURN 9FT ADLT (ELECTROSURGICAL) ×3
ELECTRODE REM PT RTRN 9FT ADLT (ELECTROSURGICAL) ×1 IMPLANT
GLOVE BIO SURGEON STRL SZ 6.5 (GLOVE) ×2 IMPLANT
GLOVE BIO SURGEONS STRL SZ 6.5 (GLOVE) ×1
GOWN STRL REUS W/ TWL LRG LVL3 (GOWN DISPOSABLE) ×2 IMPLANT
GOWN STRL REUS W/TWL LRG LVL3 (GOWN DISPOSABLE) ×6
KIT BASIN OR (CUSTOM PROCEDURE TRAY) ×3 IMPLANT
KIT TURNOVER KIT B (KITS) ×3 IMPLANT
NDL PRECISIONGLIDE 27X1.5 (NEEDLE) ×1 IMPLANT
NEEDLE PRECISIONGLIDE 27X1.5 (NEEDLE) ×3 IMPLANT
NS IRRIG 1000ML POUR BTL (IV SOLUTION) ×3 IMPLANT
PAD ARMBOARD 7.5X6 YLW CONV (MISCELLANEOUS) ×6 IMPLANT
PENCIL BUTTON HOLSTER BLD 10FT (ELECTRODE) ×3 IMPLANT
PLATE 6H STR 2.0 (Plate) ×2 IMPLANT
PLATE 6H WITH GAP 2.0 (Plate) ×2 IMPLANT
PLATE RGD 1.5/0.6 4 HOLE (Plate) ×2 IMPLANT
POSITIONER HEAD DONUT 9IN (MISCELLANEOUS) ×3 IMPLANT
PROTECTOR CORNEAL (OPHTHALMIC RELATED) IMPLANT
SCISSORS WIRE ANG 4 3/4 DISP (INSTRUMENTS) ×3 IMPLANT
SCREW BONE MANDIB SD 2X9 (Screw) ×20 IMPLANT
SCREW HT SD X-DR 1.5X5 (Screw) ×2 IMPLANT
SCREW NON LOCK HT X-DR 1.5X11 (Screw) ×2 IMPLANT
SCREW NON LOCK HT X-DR 1.5X5 (Screw) ×2 IMPLANT
SCREW NON LOCK HT X-DR 1.5X7 (Screw) ×2 IMPLANT
SCREW NON LOCK X-DR 2.0X14 (Screw) ×2 IMPLANT
SCREW NON LOCK X-DR 2.0X18 (Screw) ×2 IMPLANT
SCREW NON LOCK X-DR 2.0X8 (Screw) ×2 IMPLANT
SCREW NON LOCK X-DR 2.3X10 (Screw) ×2 IMPLANT
SCREW X-DR EMERG 2.7X10 (Screw) ×2 IMPLANT
SCREW X-DR EMERG 2.7X14 (Screw) ×2 IMPLANT
SET WALTER ACTIVATION W/DRAPE (SET/KITS/TRAYS/PACK) ×2 IMPLANT
SPLINT NASAL DOYLE BI-VL (GAUZE/BANDAGES/DRESSINGS) IMPLANT
SUT MON AB 3-0 SH 27 (SUTURE) ×6
SUT MON AB 3-0 SH27 (SUTURE) ×2 IMPLANT
SUT PROLENE 6 0 PC 1 (SUTURE) IMPLANT
SUT STEEL 0 (SUTURE)
SUT STEEL 0 18XMFL TIE 17 (SUTURE) IMPLANT
SUT STEEL 1 (SUTURE) IMPLANT
SUT STEEL 2 (SUTURE) IMPLANT
SUT STEEL 4 (SUTURE) IMPLANT
SUT VICRYL 4-0 PS2 18IN ABS (SUTURE) IMPLANT
TOWEL GREEN STERILE (TOWEL DISPOSABLE) ×3 IMPLANT
TOWEL GREEN STERILE FF (TOWEL DISPOSABLE) ×3 IMPLANT
TRAY ENT MC OR (CUSTOM PROCEDURE TRAY) ×3 IMPLANT
TW DRILL1.1X50 15MM STP W/NT (MISCELLANEOUS) ×9
WIRE 24 GAUGE OMINIMAX MMF (WIRE) ×2 IMPLANT

## 2019-04-21 NOTE — Anesthesia Procedure Notes (Addendum)
Procedure Name: Intubation Date/Time: 04/21/2019 3:00 PM Performed by: Barrington Ellison, CRNA Pre-anesthesia Checklist: Patient identified, Emergency Drugs available, Suction available and Patient being monitored Patient Re-evaluated:Patient Re-evaluated prior to induction Oxygen Delivery Method: Circle System Utilized Preoxygenation: Pre-oxygenation with 100% oxygen Induction Type: IV induction and Rapid sequence Ventilation: Mask ventilation without difficulty Laryngoscope Size: Mac and 4 Grade View: Grade I Nasal Tubes: Nasal prep performed, Nasal Rae and Left Tube size: 7.5 mm Number of attempts: 1 Airway Equipment and Method: Stylet and Oral airway Placement Confirmation: ETT inserted through vocal cords under direct vision,  positive ETCO2 and breath sounds checked- equal and bilateral Secured at: 27 cm Tube secured with: Tape Dental Injury: Teeth and Oropharynx as per pre-operative assessment

## 2019-04-21 NOTE — Anesthesia Postprocedure Evaluation (Signed)
Anesthesia Post Note  Patient: [name]  Procedure(s) Performed: OPEN REDUCTION INTERNAL FIXATION (ORIF) MAXILLA AND MANDIBULAR FRACTURE WITH MMF SCREWS, Repair of Bilateral Ear Lacerations (N/A )     Patient location during evaluation: PACU Anesthesia Type: General Level of consciousness: awake Pain management: pain level controlled Vital Signs Assessment: post-procedure vital signs reviewed and stable Respiratory status: spontaneous breathing Cardiovascular status: stable Postop Assessment: no apparent nausea or vomiting Anesthetic complications: no    Last Vitals:  Vitals:   04/21/19 1945 04/21/19 2000  BP:  (!) 141/82  Pulse: 93 98  Resp: 15 16  Temp: 36.9 C 36.9 C  SpO2: 93% 97%    Last Pain:  Vitals:   04/21/19 2000  TempSrc: Oral  PainSc:                  Julina Altmann

## 2019-04-21 NOTE — OR Nursing (Signed)
Contacted family member on the status of the procedure.

## 2019-04-21 NOTE — Progress Notes (Signed)
Pt ready for OR, has voided and signed consent after talking to Dr. Ulice Bold earlier on speaker phone with his dad.  Staph positive, pt informed.  Report given to [place], RN Short Stay.

## 2019-04-21 NOTE — Plan of Care (Signed)
  Problem: Education: Goal: Knowledge of General Education information will improve Description: Including pain rating scale, medication(s)/side effects and non-pharmacologic comfort measures Outcome: Progressing   Problem: Clinical Measurements: Goal: Ability to maintain clinical measurements within normal limits will improve Outcome: Progressing   

## 2019-04-21 NOTE — Progress Notes (Signed)
Called Dr.Dillingham's office to see if they could review what will happen in OR today so the pt can sign the permit.
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
## 2019-04-21 NOTE — Interval H&P Note (Signed)
History and Physical Interval Note:  04/21/2019 2:14 PM  Andrew Gregory  has presented today for surgery, with the diagnosis of jaw fracture.  The various methods of treatment have been discussed with the patient and family. After consideration of risks, benefits and other options for treatment, the patient has consented to  Procedure(s): OPEN REDUCTION INTERNAL FIXATION (ORIF) MAXILLA AND MANDIBULAR FRACTURE WITH MMF SCREWS (N/A) as a surgical intervention.  The patient's history has been reviewed, patient examined, no change in status, stable for surgery.  I have reviewed the patient's chart and labs.  Questions were answered to the patient's satisfaction.     Alena Bills Travone Georg

## 2019-04-21 NOTE — OR Nursing (Signed)
Drill Bit fragment broke and left in operative site, unable to retrieve. Ref #[id number]. Per Corporate treasurer of OR, Electrical engineer. Time 1730 when issue was confronted. Fluoro was conducted to confirm, time of 1750.

## 2019-04-21 NOTE — Op Note (Signed)
DATE OF OPERATION: 04/21/2019  LOCATION: [facility] In[facility]  PREOPERATIVE DIAGNOSIS: Complex open mandible fracture  POSTOPERATIVE DIAGNOSIS: Same  PROCEDURE:  1.  Open reduction internal fixation of complex open mandible fracture 2.  Right ear -repair of 3 cm laceration 3.  Left ear -repair of 3.5 cm laceration  SURGEON: Geanine Vandekamp Sanger Chelle Cayton, DO  ASSISTANT: Phoebe Sharps, PA  EBL: 10 cc  CONDITION: Stable  COMPLICATIONS: None  INDICATION: The patient, Andrew Gregory, is a 24 y.o. male born on 10-16-1995, is here for treatment of a complex open mandible fracture.   PROCEDURE DETAILS:  The patient was seen prior to surgery and marked.  The IV antibiotics were given. The patient was taken to the operating room and given a general anesthetic. A standard time out was performed and all information was confirmed by those in the room. SCDs were placed.  The patient's arms were tucked to his side.  The face was prepped and draped.  The procedure was begun with placing the patient in maxillomandibular fixation.  The arch bars were applied to the maxilla and then to the mandible.  Rubber bands were placed tightly to place the patient in maxillomandibular fixation.  The buccal mucosa of the lower dentition was injected with 1% lidocaine with epinephrine for intraoperative hemostasis and postop pain control.  The Bovie was then used to make an incision at the buccal mucosa through the muscle and to the periosteum.  Bone elevator was used to free the bone at the fracture site.  The left and right fracture was noted.  The bone segment from the inferior portion of the fracture was not initially seen.  I had to separate the right and left portions of the fracture in order to expose and retrieve the fragment.  The fragment was fragmented as well.  Using the single hooks I brought the fragment superficial while trying to keep it connected to the muscle and soft tissue posteriorly.  The reason  was for blood supply.  I placed a mini plate in order to keep it in line with the other portion of the mandible.  The drill was used to make 2 small holes and then I used the fracture reducing instrument to further reduce the fracture.  This provided for very nice occlusion of his upper and lower dentition.  The 1.54 hole plate was selected and secured to the superior portion of the mandible.  This gave stability to keep the fragment in place while I removed the mini plate.  The 2 x 9 plate was then placed and allowed for 2 screws on either side of the fracture.  I did not include the fragment as it would have shattered.  The fragment appeared to be stable in place with the reduction.  The plates were bent with the plate bender for good adherence to the bone prior to placement.  The corresponding screws were utilized with the plates.The screw sizes are noted in the chart.  While drilling one of the holes for the larger plate it was noted that the drill was short.  Upon further evaluation it was suspected that the drill had broken off in the bone.  Fluoroscopy was used and confirmed this.  It was not touching any of the other hardware.  It is a stainless steel piece.  The decision was made not to disrupt the repair or risk losing the fragment.  There was also concerned that the fragment would crumble if it was manipulated any further.  The muscle was then reapproximated with the 4-0 Vicryl.  The buccal mucosa was closed with vertical mattress 4-0 Vicryl.  The patient was then taken out of MMF and the arch bars removed.  There was good occlusion.   Left Ear: It was noticed that the patient had a laceration of the posterior aspect of the left ear 3.5 cm in length.  It was right at the juncture of the postauricular area to the skull on the superior aspect and wrapped around the anterior helix.  This was cleaned with Betadine and the skin reapproximated with 5-0 Monocryl.  Interrupted sutures were utilized.  Right  Ear: The right ear was prepped with Betadine and had a 1 cm laceration on the anterior aspect of the ear this was closed with a single 5-0 Monocryl.  The posterior middle portion of the ear had a 2.5 cm laceration.  The skin was reapproximated with 5-0 Monocryl. The patient was allowed to wake up and taken to recovery room in stable condition at the end of the case. The family was notified at the end of the case and all the above information discussed.   The advanced practice practitioner (APP) assisted throughout the case.  The APP was essential in retraction and counter traction when needed to make the case progress smoothly.  This retraction and assistance made it possible to see the tissue plans for the procedure.  The assistance was needed for blood control, tissue re-approximation and assisted with closure of the incision site.    The 21st Century Cures Act was signed into law in 2016 which includes the topic of electronic health records.  This provides immediate access to information in MyChart.  This includes consultation notes, operative notes, office notes, lab results and pathology reports.  If you have any questions about what you read please let us know at your next visit or call us at the office.  We are right here with you.

## 2019-04-21 NOTE — Transfer of Care (Signed)
Immediate Anesthesia Transfer of Care Note  Patient: Andrew Gregory  Procedure(s) Performed: OPEN REDUCTION INTERNAL FIXATION (ORIF) MAXILLA AND MANDIBULAR FRACTURE WITH MMF SCREWS, Repair of Bilateral Ear Lacerations (N/A )  Patient Location: PACU  Anesthesia Type:General  Level of Consciousness: lethargic and responds to stimulation  Airway & Oxygen Therapy: Patient Spontanous Breathing  Post-op Assessment: Report given to RN  Post vital signs: Reviewed and stable  Last Vitals:  Vitals Value Taken Time  BP 148/84 04/21/19 1903  Temp    Pulse 102 04/21/19 1906  Resp 13 04/21/19 1906  SpO2 92 % 04/21/19 1906  Vitals shown include unvalidated device data.  Last Pain:  Vitals:   04/21/19 1421  TempSrc:   PainSc: 4       Patients Stated Pain Goal: 3 (04/21/19 1421)  Complications: No apparent anesthesia complications
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
## 2019-04-21 NOTE — Progress Notes (Signed)
Pt is unable to answer  Questions req. H&P.  He cant open his mouth.  And unable to take tylenol.

## 2019-04-22 ENCOUNTER — Encounter (HOSPITAL_COMMUNITY): Payer: Self-pay | Admitting: Surgery

## 2019-04-22 LAB — SARS CORONAVIRUS 2 (TAT 6-24 HRS): SARS Coronavirus 2: NEGATIVE

## 2019-04-22 MED ORDER — TRAMADOL HCL 50 MG PO TABS: 50.0000 mg | ORAL_TABLET | Freq: Four times a day (QID) | ORAL | 0 refills | Status: DC | PRN

## 2019-04-22 MED ORDER — MORPHINE SULFATE (PF) 2 MG/ML IV SOLN: 2.0000 mg | INTRAVENOUS | Status: DC | PRN

## 2019-04-22 MED ORDER — ACETAMINOPHEN 500 MG PO TABS
1000.0000 mg | ORAL_TABLET | Freq: Four times a day (QID) | ORAL | Status: DC
  Administered 2019-04-22: 1000 mg via ORAL
  Filled 2019-04-22: qty 2

## 2019-04-22 MED ORDER — ONDANSETRON 4 MG PO TBDP: 4.0000 mg | ORAL_TABLET | Freq: Four times a day (QID) | ORAL | 0 refills | Status: DC | PRN

## 2019-04-22 MED ORDER — METHOCARBAMOL 500 MG PO TABS
500.0000 mg | ORAL_TABLET | Freq: Four times a day (QID) | ORAL | Status: DC
  Administered 2019-04-22 (×2): 500 mg via ORAL
  Filled 2019-04-22 (×2): qty 1

## 2019-04-22 MED ORDER — METHOCARBAMOL 500 MG PO TABS: 500.0000 mg | ORAL_TABLET | Freq: Four times a day (QID) | ORAL | 0 refills | Status: DC | PRN

## 2019-04-22 MED ORDER — TRAMADOL HCL 50 MG PO TABS
50.0000 mg | ORAL_TABLET | Freq: Four times a day (QID) | ORAL | Status: DC | PRN
  Administered 2019-04-22: 10:00:00 50 mg via ORAL
  Filled 2019-04-22: qty 1

## 2019-04-22 MED ORDER — ACETAMINOPHEN 500 MG PO TABS: 1000.0000 mg | ORAL_TABLET | Freq: Three times a day (TID) | ORAL | 0 refills | Status: DC | PRN

## 2019-04-22 MED ORDER — OXYCODONE HCL 5 MG PO TABS: 5.0000 mg | ORAL_TABLET | Freq: Four times a day (QID) | ORAL | 0 refills | Status: DC | PRN

## 2019-04-22 NOTE — Progress Notes (Signed)
Patient discharged to home with instructions given to patient and father.
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
## 2019-04-22 NOTE — Progress Notes (Signed)
Patient to be discharged. I called [facility] [phone] and cancelled previous clindamycin and percocet rx that was sent while patient was still in the ED.

## 2019-04-23 ENCOUNTER — Other Ambulatory Visit: Payer: Self-pay

## 2019-04-23 ENCOUNTER — Telehealth: Payer: Self-pay | Admitting: Plastic Surgery

## 2019-04-23 ENCOUNTER — Encounter: Payer: Self-pay | Admitting: *Deleted

## 2019-04-23 ENCOUNTER — Encounter: Payer: Self-pay | Admitting: Family Medicine

## 2019-04-23 NOTE — Telephone Encounter (Signed)
Father, Orvilla Fus, called to ask a question about son's pain medication. Please call him back to discuss.

## 2019-04-23 NOTE — Anesthesia Preprocedure Evaluation (Addendum)
Anesthesia Evaluation  Patient identified by MRN, date of birth, ID band Patient awake    Reviewed: Allergy & Precautions, NPO status , Patient's Chart, lab work & pertinent test results  Airway Mallampati: II  TM Distance: >3 FB Neck ROM: Limited    Dental  (+) Dental Advisory Given   Pulmonary neg pulmonary ROS,    Pulmonary exam normal breath sounds clear to auscultation       Cardiovascular Normal cardiovascular exam+ dysrhythmias (LBBB)  Rhythm:Regular Rate:Normal     Neuro/Psych negative neurological ROS  negative psych ROS   GI/Hepatic negative GI ROS, Neg liver ROS,   Endo/Other  negative endocrine ROS  Renal/GU negative Renal ROS     Musculoskeletal L SHOULDER INSTABILITY   Abdominal   Peds  Hematology  (+) Blood dyscrasia, anemia ,   Anesthesia Other Findings Day of surgery medications reviewed with the patient.  Reproductive/Obstetrics                           Anesthesia Physical Anesthesia Plan  ASA: II  Anesthesia Plan: General   Post-op Pain Management:  Regional for Post-op pain   Induction: Intravenous  PONV Risk Score and Plan: 2 and Midazolam, Dexamethasone and Ondansetron  Airway Management Planned: Oral ETT  Additional Equipment:   Intra-op Plan:   Post-operative Plan: Extubation in OR  Informed Consent: I have reviewed the patients History and Physical, chart, labs and discussed the procedure including the risks, benefits and alternatives for the proposed anesthesia with the patient or authorized representative who has indicated his/her understanding and acceptance.     Dental advisory given  Plan Discussed with: CRNA  Anesthesia Plan Comments: (PAT note written 04/23/2019 by Shonna Chock, PA-C. For STAT CBC & CMET on arrival.  )      Anesthesia Quick Evaluation

## 2019-04-23 NOTE — Progress Notes (Signed)
Spoke with pt's father, Andrew Gregory for pre-op call. Pt has a mandibular injury and cannot talk at this time. Dad denies any cardiac history for pt. Pt is not diabetic.  Covid test done on 04/22/19 and it is negative. Dad states pt is in quarantine since the test was done and will continue to be in quarantine until he comes to the hospital on Monday.  Gave Dad instructions that pt is not to eat after midnight Sunday. He may have clear liquids until 4:15 AM. He voiced understanding
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
## 2019-04-23 NOTE — Telephone Encounter (Signed)
Returned fathers call. He was inquiring if there was an alternate pain medication other than the oxycodone. He was concerned about the possibility of being addicted. Suggested to alternate Tylenol and ibuprofen (if he can take those). He said he was having surgery Monday. Advised him to take the tylenol and follow the directions on the bottle.

## 2019-04-26 ENCOUNTER — Ambulatory Visit (HOSPITAL_COMMUNITY): Payer: 59

## 2019-04-26 ENCOUNTER — Ambulatory Visit (HOSPITAL_COMMUNITY): Payer: 59 | Admitting: Vascular Surgery

## 2019-04-26 ENCOUNTER — Ambulatory Visit (HOSPITAL_COMMUNITY)
Admission: RE | Admit: 2019-04-26 | Discharge: 2019-04-26 | Disposition: A | Discharge status: Home / Self Care | Payer: 59 | Attending: Orthopaedic Surgery | Admitting: Orthopaedic Surgery

## 2019-04-26 ENCOUNTER — Encounter (HOSPITAL_COMMUNITY): Payer: Self-pay | Admitting: Orthopaedic Surgery

## 2019-04-26 ENCOUNTER — Other Ambulatory Visit: Payer: Self-pay

## 2019-04-26 ENCOUNTER — Encounter (HOSPITAL_COMMUNITY)
Admission: RE | Disposition: A | Discharge status: Home / Self Care | Payer: Self-pay | Source: Home / Self Care | Attending: Orthopaedic Surgery

## 2019-04-26 DIAGNOSIS — Z79899 Other long term (current) drug therapy: Secondary | ICD-10-CM | POA: Insufficient documentation

## 2019-04-26 DIAGNOSIS — S43102A Unspecified dislocation of left acromioclavicular joint, initial encounter: Secondary | ICD-10-CM | POA: Diagnosis present

## 2019-04-26 DIAGNOSIS — S0003XA Contusion of scalp, initial encounter: Secondary | ICD-10-CM | POA: Insufficient documentation

## 2019-04-26 DIAGNOSIS — D649 Anemia, unspecified: Secondary | ICD-10-CM | POA: Insufficient documentation

## 2019-04-26 DIAGNOSIS — S43122A Dislocation of left acromioclavicular joint, 100%-200% displacement, initial encounter: Secondary | ICD-10-CM | POA: Insufficient documentation

## 2019-04-26 DIAGNOSIS — S0240CA Maxillary fracture, right side, initial encounter for closed fracture: Secondary | ICD-10-CM | POA: Diagnosis not present

## 2019-04-26 DIAGNOSIS — I4510 Unspecified right bundle-branch block: Secondary | ICD-10-CM | POA: Diagnosis not present

## 2019-04-26 DIAGNOSIS — Z419 Encounter for procedure for purposes other than remedying health state, unspecified: Secondary | ICD-10-CM

## 2019-04-26 HISTORY — PX: SHOULDER ARTHROSCOPY: SHX128

## 2019-04-26 SURGERY — ARTHROSCOPY, SHOULDER
Anesthesia: General | Site: Shoulder | Laterality: Left

## 2019-04-26 MED ORDER — BUPIVACAINE LIPOSOME 1.3 % IJ SUSP
INTRAMUSCULAR | Status: DC | PRN
  Administered 2019-04-26: 10 mL via PERINEURAL

## 2019-04-26 MED ORDER — CEFAZOLIN SODIUM-DEXTROSE 2-4 GM/100ML-% IV SOLN
2.0000 g | INTRAVENOUS | Status: AC
  Administered 2019-04-26: 2 g via INTRAVENOUS

## 2019-04-26 MED ORDER — DEXAMETHASONE SODIUM PHOSPHATE 10 MG/ML IJ SOLN
INTRAMUSCULAR | Status: DC | PRN
  Administered 2019-04-26: 10 mg via INTRAVENOUS

## 2019-04-26 MED ORDER — PHENYLEPHRINE 40 MCG/ML (10ML) SYRINGE FOR IV PUSH (FOR BLOOD PRESSURE SUPPORT)
PREFILLED_SYRINGE | INTRAVENOUS | Status: DC | PRN
  Administered 2019-04-26 (×2): 120 ug via INTRAVENOUS
  Administered 2019-04-26 (×2): 80 ug via INTRAVENOUS

## 2019-04-26 MED ORDER — ROCURONIUM BROMIDE 10 MG/ML (PF) SYRINGE
PREFILLED_SYRINGE | INTRAVENOUS | Status: DC | PRN
  Administered 2019-04-26: 50 mg via INTRAVENOUS
  Administered 2019-04-26: 10 mg via INTRAVENOUS
  Administered 2019-04-26: 20 mg via INTRAVENOUS
  Administered 2019-04-26: 10 mg via INTRAVENOUS

## 2019-04-26 MED ORDER — SODIUM CHLORIDE 0.9 % IR SOLN
Status: DC | PRN
  Administered 2019-04-26 (×3): 3000 mL

## 2019-04-26 MED ORDER — MELOXICAM 7.5 MG PO TABS: 7.5000 mg | ORAL_TABLET | Freq: Every day | ORAL | 2 refills | Status: DC

## 2019-04-26 MED ORDER — ARTIFICIAL TEARS OPHTHALMIC OINT
TOPICAL_OINTMENT | OPHTHALMIC | Status: DC | PRN
  Administered 2019-04-26: 1 via OPHTHALMIC

## 2019-04-26 MED ORDER — OXYCODONE HCL 5 MG PO TABS: ORAL_TABLET | ORAL | 0 refills | Status: AC

## 2019-04-26 MED ORDER — PROPOFOL 10 MG/ML IV BOLUS
INTRAVENOUS | Status: DC | PRN
  Administered 2019-04-26: 150 mg via INTRAVENOUS

## 2019-04-26 MED ORDER — BUPIVACAINE HCL (PF) 0.5 % IJ SOLN
INTRAMUSCULAR | Status: DC | PRN
  Administered 2019-04-26: 15 mL via PERINEURAL

## 2019-04-26 MED ORDER — SODIUM CHLORIDE 0.9 % IV SOLN
INTRAVENOUS | Status: AC | PRN
  Administered 2019-04-26: 1000 mL via INTRAMUSCULAR

## 2019-04-26 MED ORDER — ACETAMINOPHEN 500 MG PO TABS: 1000.0000 mg | ORAL_TABLET | Freq: Three times a day (TID) | ORAL | 0 refills | Status: AC

## 2019-04-26 MED ORDER — OXYCODONE HCL 5 MG PO TABS
10.0000 mg | ORAL_TABLET | Freq: Four times a day (QID) | ORAL | Status: DC | PRN
  Administered 2019-04-26: 10 mg via ORAL

## 2019-04-26 MED ORDER — VANCOMYCIN HCL 1000 MG IV SOLR
INTRAVENOUS | Status: DC | PRN
  Administered 2019-04-26: 1000 mg

## 2019-04-26 MED ORDER — LACTATED RINGERS IV SOLN: INTRAVENOUS | Status: DC | PRN

## 2019-04-26 MED ORDER — ONDANSETRON HCL 4 MG/2ML IJ SOLN
INTRAMUSCULAR | Status: DC | PRN
  Administered 2019-04-26: 4 mg via INTRAVENOUS

## 2019-04-26 MED ORDER — PHENYLEPHRINE HCL-NACL 10-0.9 MG/250ML-% IV SOLN
INTRAVENOUS | Status: DC | PRN
  Administered 2019-04-26: 10 ug/min via INTRAVENOUS

## 2019-04-26 MED ORDER — ONDANSETRON HCL 4 MG PO TABS: 4.0000 mg | ORAL_TABLET | Freq: Three times a day (TID) | ORAL | 1 refills | Status: AC | PRN

## 2019-04-26 MED ORDER — VANCOMYCIN HCL 1000 MG IV SOLR
INTRAVENOUS | Status: AC
  Filled 2019-04-26: qty 1000

## 2019-04-26 MED ORDER — FENTANYL CITRATE (PF) 250 MCG/5ML IJ SOLN
INTRAMUSCULAR | Status: DC | PRN
  Administered 2019-04-26 (×2): 50 ug via INTRAVENOUS
  Administered 2019-04-26: 25 ug via INTRAVENOUS
  Administered 2019-04-26 (×2): 50 ug via INTRAVENOUS
  Administered 2019-04-26: 100 ug via INTRAVENOUS
  Administered 2019-04-26: 25 ug via INTRAVENOUS

## 2019-04-26 MED ORDER — MIDAZOLAM HCL 5 MG/5ML IJ SOLN
INTRAMUSCULAR | Status: DC | PRN
  Administered 2019-04-26: 2 mg via INTRAVENOUS

## 2019-04-26 MED ORDER — OXYCODONE HCL 5 MG PO TABS
ORAL_TABLET | ORAL | Status: AC
  Filled 2019-04-26: qty 2

## 2019-04-26 MED ORDER — ACETAMINOPHEN 500 MG PO TABS: 1000.0000 mg | ORAL_TABLET | Freq: Once | ORAL | Status: DC

## 2019-04-26 MED ORDER — SUGAMMADEX SODIUM 200 MG/2ML IV SOLN
INTRAVENOUS | Status: DC | PRN
  Administered 2019-04-26: 200 mg via INTRAVENOUS

## 2019-04-26 MED ORDER — CHLORHEXIDINE GLUCONATE 4 % EX LIQD: 60.0000 mL | Freq: Once | CUTANEOUS | Status: DC

## 2019-04-26 SURGICAL SUPPLY — 66 items
BENZOIN TINCTURE PRP APPL 2/3 (GAUZE/BANDAGES/DRESSINGS) IMPLANT
BLADE CUTTER GATOR 3.5 (BLADE) IMPLANT
BLADE EXCALIBUR 4.0MM X 13CM (MISCELLANEOUS) ×2
BLADE EXCALIBUR 4.0X13 (MISCELLANEOUS) ×4 IMPLANT
BUR OVAL 6.0 (BURR) IMPLANT
CANNULA 5.75X71 LONG (CANNULA) IMPLANT
CANNULA PASSPORT BUTTON 10-40 (CANNULA) ×3 IMPLANT
CANNULA TWIST IN 8.25X7CM (CANNULA) ×3 IMPLANT
CLOSURE WOUND 1/2 X4 (GAUZE/BANDAGES/DRESSINGS) ×1
COVER WAND RF STERILE (DRAPES) ×3 IMPLANT
CUTTER BONE 4.0MM X 13CM (MISCELLANEOUS) IMPLANT
DRAPE C-ARM 42X72 X-RAY (DRAPES) ×3 IMPLANT
DRAPE ORTHO SPLIT 77X108 STRL (DRAPES) ×6
DRAPE STERI 35X30 U-POUCH (DRAPES) ×6 IMPLANT
DRAPE SURG ORHT 6 SPLT 77X108 (DRAPES) ×2 IMPLANT
DRAPE U-SHAPE 47X51 STRL (DRAPES) ×3 IMPLANT
DRSG AQUACEL AG ADV 3.5X 4 (GAUZE/BANDAGES/DRESSINGS) ×3 IMPLANT
DRSG EMULSION OIL 3X3 NADH (GAUZE/BANDAGES/DRESSINGS) ×3 IMPLANT
DRSG PAD ABDOMINAL 8X10 ST (GAUZE/BANDAGES/DRESSINGS) ×3 IMPLANT
DURAPREP 26ML APPLICATOR (WOUND CARE) ×6 IMPLANT
DW OUTFLOW CASSETTE/TUBE SET (MISCELLANEOUS) ×3 IMPLANT
ELECT REM PT RETURN 9FT ADLT (ELECTROSURGICAL)
ELECTRODE REM PT RTRN 9FT ADLT (ELECTROSURGICAL) IMPLANT
FIBERSTICK 2 (SUTURE) ×6 IMPLANT
GAUZE SPONGE 4X4 12PLY STRL (GAUZE/BANDAGES/DRESSINGS) ×3 IMPLANT
GLOVE BIO SURGEON STRL SZ7 (GLOVE) ×3 IMPLANT
GLOVE BIOGEL PI IND STRL 7.0 (GLOVE) ×1 IMPLANT
GLOVE BIOGEL PI IND STRL 8 (GLOVE) ×1 IMPLANT
GLOVE BIOGEL PI INDICATOR 7.0 (GLOVE) ×2
GLOVE BIOGEL PI INDICATOR 8 (GLOVE) ×2
GLOVE ECLIPSE 8.0 STRL XLNG CF (GLOVE) ×6 IMPLANT
GOWN STRL REUS W/ TWL LRG LVL3 (GOWN DISPOSABLE) ×2 IMPLANT
GOWN STRL REUS W/ TWL XL LVL3 (GOWN DISPOSABLE) ×2 IMPLANT
GOWN STRL REUS W/TWL LRG LVL3 (GOWN DISPOSABLE) ×6
GOWN STRL REUS W/TWL XL LVL3 (GOWN DISPOSABLE) ×6
KIT BASIN OR (CUSTOM PROCEDURE TRAY) ×3 IMPLANT
MANIFOLD NEPTUNE II (INSTRUMENTS) ×3 IMPLANT
NEEDLE SCORPION MULTI FIRE (NEEDLE) IMPLANT
NS IRRIG 1000ML POUR BTL (IV SOLUTION) IMPLANT
PACK ARTHROSCOPY DSU (CUSTOM PROCEDURE TRAY) ×3 IMPLANT
PACK SHOULDER (CUSTOM PROCEDURE TRAY) ×3 IMPLANT
RESTRAINT HEAD UNIVERSAL NS (MISCELLANEOUS) ×3 IMPLANT
SLING ARM FOAM STRAP MED (SOFTGOODS) IMPLANT
SLING ARM FOAM STRAP XLG (SOFTGOODS) IMPLANT
SLING ARM IMMOBILIZER LRG (SOFTGOODS) IMPLANT
SLING ARM IMMOBILIZER MED (SOFTGOODS) IMPLANT
SPONGE LAP 4X18 RFD (DISPOSABLE) IMPLANT
STRIP CLOSURE SKIN 1/2X4 (GAUZE/BANDAGES/DRESSINGS) ×2 IMPLANT
SUT ETHILON 3 0 PS 1 (SUTURE) ×3 IMPLANT
SUT FIBERWIRE #2 38 T-5 BLUE (SUTURE) ×6
SUT MNCRL AB 3-0 PS2 18 (SUTURE) ×3 IMPLANT
SUT TIGER TAPE 7 IN WHITE (SUTURE) IMPLANT
SUT TIGERTAPE CERCLAGE (Miscellaneous) ×3 IMPLANT
SUT VIC AB 0 CT1 27 (SUTURE) ×3
SUT VIC AB 0 CT1 27XBRD ANBCTR (SUTURE) ×1 IMPLANT
SUT VIC AB 3-0 CT1 27 (SUTURE) ×3
SUT VIC AB 3-0 CT1 TAPERPNT 27 (SUTURE) ×1 IMPLANT
SUTURE FIBERWR #2 38 T-5 BLUE (SUTURE) ×2 IMPLANT
TAPE CLOTH SURG 4X10 WHT LF (GAUZE/BANDAGES/DRESSINGS) ×3 IMPLANT
TAPE FIBER 2MM 7IN #2 BLUE (SUTURE) IMPLANT
TENDON ANTERIOR TIBIALIS (Tissue) ×3 IMPLANT
TIGER TAPE CERCLAGE SUTURE ×3 IMPLANT
TOWEL GREEN STERILE FF (TOWEL DISPOSABLE) ×3 IMPLANT
TUBING ARTHROSCOPY IRRIG 16FT (MISCELLANEOUS) ×3 IMPLANT
WAND STAR VAC 90 (SURGICAL WAND) IMPLANT
WATER STERILE IRR 3000ML UROMA (IV SOLUTION) ×3 IMPLANT

## 2019-04-26 NOTE — Anesthesia Procedure Notes (Signed)
Procedure Name: Intubation Date/Time: 04/26/2019 7:23 AM Performed by: Nils Pyle, CRNA Pre-anesthesia Checklist: Patient identified, Emergency Drugs available, Suction available and Patient being monitored Patient Re-evaluated:Patient Re-evaluated prior to induction Oxygen Delivery Method: Circle System Utilized Preoxygenation: Pre-oxygenation with 100% oxygen Induction Type: IV induction Ventilation: Mask ventilation without difficulty Laryngoscope Size: Miller and 2 Grade View: Grade II Tube type: Oral Tube size: 7.5 mm Number of attempts: 1 Airway Equipment and Method: Stylet and Oral airway Placement Confirmation: ETT inserted through vocal cords under direct vision,  positive ETCO2 and breath sounds checked- equal and bilateral Secured at: 23 cm Tube secured with: Tape Dental Injury: Teeth and Oropharynx as per pre-operative assessment
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
## 2019-04-26 NOTE — Transfer of Care (Signed)
Immediate Anesthesia Transfer of Care Note  Patient: Andrew Gregory  Procedure(s) Performed: ARTHROSCOPY SHOULDER WITH DEBRIDEMENT CORACOCLAVICULAR LIGAMENT RECONSTRUCTION (Left Shoulder)  Patient Location: PACU  Anesthesia Type:General and Regional  Level of Consciousness: awake, alert  and oriented  Airway & Oxygen Therapy: Patient Spontanous Breathing  Post-op Assessment: Report given to RN and Post -op Vital signs reviewed and stable  Post vital signs: Reviewed and stable  Last Vitals:  Vitals Value Taken Time  BP 138/89 04/26/19 0943  Temp    Pulse 92 04/26/19 0944  Resp 17 04/26/19 0944  SpO2 97 % 04/26/19 0944  Vitals shown include unvalidated device data.  Last Pain:  Vitals:   04/26/19 0653  TempSrc: Axillary  PainSc:          Complications: No apparent anesthesia complications

## 2019-04-26 NOTE — Progress Notes (Signed)
Orthopedic Tech Progress Note Patient Details:  Andrew Gregory 1995/04/14 [id number]  Ortho Devices Type of Ortho Device: Abduction pillow       Saul Fordyce 04/26/2019, 12:43 PM

## 2019-04-26 NOTE — Anesthesia Postprocedure Evaluation (Signed)
Anesthesia Post Note  Patient: Andrew Gregory  Procedure(s) Performed: ARTHROSCOPY SHOULDER WITH DEBRIDEMENT CORACOCLAVICULAR LIGAMENT RECONSTRUCTION (Left Shoulder)     Patient location during evaluation: PACU Anesthesia Type: General Level of consciousness: awake and alert Pain management: pain level controlled Vital Signs Assessment: post-procedure vital signs reviewed and stable Respiratory status: spontaneous breathing, nonlabored ventilation and respiratory function stable Cardiovascular status: blood pressure returned to baseline and stable Postop Assessment: no apparent nausea or vomiting Anesthetic complications: no    Last Vitals:  Vitals:   04/26/19 1015 04/26/19 1019  BP: (!) 144/83   Pulse: 73 71  Resp: 14 15  Temp: (!) 36.1 C   SpO2: 98% 100%    Last Pain:  Vitals:   04/26/19 1007  TempSrc:   PainSc: 0-No pain                 Cecile Hearing

## 2019-04-26 NOTE — Interval H&P Note (Signed)
History and Physical Interval Note:  04/26/2019 6:59 AM  Andrew Gregory  has presented today for surgery, with the diagnosis of SHOULDER INSTABILITY.  The various methods of treatment have been discussed with the patient and family. After consideration of risks, benefits and other options for treatment, the patient has consented to  Procedure(s): ARTHROSCOPY SHOULDER WITH DEBRIDEMENT CORACOCLAZICULAR LIGAMENT RECONSTRUCTION (Left) as a surgical intervention.  The patient's history has been reviewed, patient examined, no change in status, stable for surgery.  I have reviewed the patient's chart and labs.  Questions were answered to the patient's satisfaction.     Bjorn Pippin

## 2019-04-26 NOTE — Anesthesia Procedure Notes (Signed)
Anesthesia Regional Block: Interscalene brachial plexus block   Pre-Anesthetic Checklist: ,, timeout performed, Correct Patient, Correct Site, Correct Laterality, Correct Procedure, Correct Position, site marked, Risks and benefits discussed,  Surgical consent,  Pre-op evaluation,  At surgeon's request and post-op pain management  Laterality: Left  Prep: chloraprep       Needles:  Injection technique: Single-shot  Needle Type: Echogenic Stimulator Needle     Needle Length: 5cm  Needle Gauge: 22     Additional Needles:   Procedures:,,,, ultrasound used (permanent image in chart),,,,  Narrative:  Start time: 04/26/2019 7:00 AM End time: 04/26/2019 7:07 AM Injection made incrementally with aspirations every 5 mL.  Performed by: Personally  Anesthesiologist: Cecile Hearing, MD  Additional Notes: Functioning IV was confirmed and monitors were applied.  A 60mm 22ga Arrow echogenic stimulator needle was used. Sterile prep and drape, hand hygiene, and sterile gloves were used.  Negative aspiration and negative test dose prior to incremental administration of local anesthetic. The patient tolerated the procedure well.  Ultrasound guidance: relevent anatomy identified, needle position confirmed, local anesthetic spread visualized around nerve(s), vascular puncture avoided.  Image printed for medical record.

## 2019-04-29 ENCOUNTER — Encounter: Payer: Self-pay | Admitting: *Deleted

## 2019-05-10 ENCOUNTER — Other Ambulatory Visit: Payer: Self-pay

## 2019-05-10 ENCOUNTER — Encounter: Payer: Self-pay | Admitting: Plastic Surgery

## 2019-05-10 ENCOUNTER — Ambulatory Visit (INDEPENDENT_AMBULATORY_CARE_PROVIDER_SITE_OTHER): Payer: 59 | Admitting: Plastic Surgery

## 2019-05-10 VITALS — BP 134/77 | HR 87 | Temp 98.2°F | Ht 72.0 in | Wt 134.4 lb

## 2019-05-10 DIAGNOSIS — S02600B Fracture of unspecified part of body of mandible, initial encounter for open fracture: Secondary | ICD-10-CM
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
## 2019-05-13 ENCOUNTER — Telehealth: Payer: Self-pay | Admitting: Plastic Surgery

## 2019-05-13 NOTE — Telephone Encounter (Signed)
Returned patients call, LMVM. Advised patient that the stitches that were used are dissolvable and could be some scar tissue. This will resolve with in time. If it becomes angry red, draining with pus, hot to touch or a fever spiking over 101, to please call our office.

## 2019-05-13 NOTE — Telephone Encounter (Signed)
Pt had stitches removed on Monday and a bump is there now and he wanted to know whether this was normal or if there is something he should do about it. Please call him to advise.

## 2019-05-13 NOTE — Telephone Encounter (Signed)
Without evaluating him, I cannot confirm nor deny, but this does not sound worrisome and should resolve with time. Confirm where the sutures were removed and how large the bump is. If it is not large or causing any pain, it should resolve with time.

## 2019-07-13 ENCOUNTER — Ambulatory Visit (INDEPENDENT_AMBULATORY_CARE_PROVIDER_SITE_OTHER): Payer: 59 | Admitting: Plastic Surgery

## 2019-07-13 ENCOUNTER — Encounter: Payer: Self-pay | Admitting: Plastic Surgery

## 2019-07-13 ENCOUNTER — Other Ambulatory Visit: Payer: Self-pay

## 2019-07-13 VITALS — BP 134/93 | HR 69 | Temp 98.2°F | Ht 72.0 in | Wt 137.0 lb

## 2019-07-13 DIAGNOSIS — S02609D Fracture of mandible, unspecified, subsequent encounter for fracture with routine healing: Secondary | ICD-10-CM

## 2019-07-13 NOTE — Progress Notes (Signed)
   Subjective:    Patient ID: Andrew Gregory, male    DOB: 10/30/1995, 24 y.o.   MRN: [id number]  The patient is a 24 yrs old male here with his dad for follow up on his mandible fracture.  His mom works for an [employer]. The surgeon noted a possible infection of the mandible and treated him with antibiotics.  That seems to have improved.  He will likely remove the hardware in the next few months. He has good occlusion.     Review of Systems  Constitutional: Negative.   HENT: Negative.   Eyes: Negative.   Respiratory: Negative.   Cardiovascular: Negative.   Gastrointestinal: Negative.   Genitourinary: Negative.   Musculoskeletal: Negative.        Objective:   Physical Exam Vitals and nursing note reviewed.  Constitutional:      Appearance: Normal appearance.  Cardiovascular:     Rate and Rhythm: Normal rate.     Pulses: Normal pulses.  Pulmonary:     Effort: Pulmonary effort is normal.  Neurological:     General: No focal deficit present.     Mental Status: He is alert and oriented to person, place, and time.  Psychiatric:        Mood and Affect: Mood normal.        Behavior: Behavior normal.        Assessment & Plan:     ICD-10-CM   1. Open fracture of mandible with routine healing, unspecified laterality, unspecified mandibular site, subsequent encounter  S02.609D     Plan to follow.  I would like to see him back before he goes into the [employer].  Pictures were obtained of the patient and placed in the chart with the patient's or guardian's permission.

## 2019-10-05 ENCOUNTER — Encounter: Payer: Self-pay | Admitting: Family Medicine

## 2019-10-05 ENCOUNTER — Encounter: Payer: Self-pay | Admitting: Plastic Surgery

## 2019-10-05 ENCOUNTER — Other Ambulatory Visit: Payer: Self-pay

## 2019-10-05 ENCOUNTER — Ambulatory Visit (INDEPENDENT_AMBULATORY_CARE_PROVIDER_SITE_OTHER): Payer: 59 | Admitting: Plastic Surgery

## 2019-10-05 ENCOUNTER — Ambulatory Visit (INDEPENDENT_AMBULATORY_CARE_PROVIDER_SITE_OTHER): Payer: 59 | Admitting: Family Medicine

## 2019-10-05 VITALS — BP 100/70 | HR 57 | Temp 98.3°F | Ht 71.5 in | Wt 137.5 lb

## 2019-10-05 VITALS — BP 120/73 | HR 78 | Temp 98.1°F

## 2019-10-05 DIAGNOSIS — R109 Unspecified abdominal pain: Secondary | ICD-10-CM | POA: Diagnosis not present

## 2019-10-05 DIAGNOSIS — S02609D Fracture of mandible, unspecified, subsequent encounter for fracture with routine healing: Secondary | ICD-10-CM | POA: Diagnosis not present

## 2019-10-05 DIAGNOSIS — N3943 Post-void dribbling: Secondary | ICD-10-CM | POA: Insufficient documentation

## 2019-10-05 LAB — POC URINALSYSI DIPSTICK (AUTOMATED)
Bilirubin, UA: NEGATIVE
Blood, UA: NEGATIVE
Glucose, UA: NEGATIVE
Ketones, UA: NEGATIVE
Leukocytes, UA: NEGATIVE
Nitrite, UA: NEGATIVE
Protein, UA: NEGATIVE
Spec Grav, UA: 1.015 (ref 1.010–1.025)
Urobilinogen, UA: 0.2 E.U./dL
pH, UA: 7.5 (ref 5.0–8.0)

## 2019-10-05 NOTE — Assessment & Plan Note (Signed)
Eval for STDs.Marland Kitchen GC/Chlam
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
## 2019-10-05 NOTE — Assessment & Plan Note (Signed)
Normal urinalysis.Marland Kitchen no sign of urinary infection. Will eval for GC/Chlam.  Low back pain is most likely MSK strain.Marland Kitchen start heat, massage, gentle stretching and NSAIDS prn.
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
## 2019-10-05 NOTE — Patient Instructions (Addendum)
No sign of Urinary infection.  Start heat on low back and gentle stretching.  Can use ibuprofen 600-800 mg three times daily as needed for pain.  We will call with results of gonorrhea and chlamydia testing.  Call if leaky urination not improving.. can consider STD testing repeat

## 2019-10-06 LAB — C. TRACHOMATIS/N. GONORRHOEAE RNA
C. trachomatis RNA, TMA: NOT DETECTED
N. gonorrhoeae RNA, TMA: NOT DETECTED

## 2019-10-12 ENCOUNTER — Ambulatory Visit (INDEPENDENT_AMBULATORY_CARE_PROVIDER_SITE_OTHER): Payer: 59 | Admitting: Family Medicine

## 2019-10-12 ENCOUNTER — Other Ambulatory Visit: Payer: Self-pay

## 2019-10-12 ENCOUNTER — Encounter: Payer: Self-pay | Admitting: Family Medicine

## 2019-10-12 VITALS — BP 126/80 | HR 63 | Temp 98.3°F | Ht 71.5 in | Wt 139.0 lb

## 2019-10-12 DIAGNOSIS — N3943 Post-void dribbling: Secondary | ICD-10-CM

## 2019-10-12 NOTE — Progress Notes (Signed)
Gave patient results we tried to communicate with him on Mychart and phone.   No charge.

## 2020-01-29 ENCOUNTER — Encounter: Payer: Self-pay | Admitting: Family Medicine

## 2020-01-29 ENCOUNTER — Telehealth (INDEPENDENT_AMBULATORY_CARE_PROVIDER_SITE_OTHER): Payer: 59 | Admitting: Family Medicine

## 2020-01-29 VITALS — Ht 71.5 in

## 2020-01-29 DIAGNOSIS — J029 Acute pharyngitis, unspecified: Secondary | ICD-10-CM

## 2020-01-29 DIAGNOSIS — J039 Acute tonsillitis, unspecified: Secondary | ICD-10-CM | POA: Diagnosis not present

## 2020-01-29 MED ORDER — AMOXICILLIN 500 MG PO CAPS: 500.0000 mg | ORAL_CAPSULE | Freq: Two times a day (BID) | ORAL | 0 refills | Status: AC

## 2020-01-29 NOTE — Progress Notes (Signed)
Virtual Visit via Video Note I connected with Andrew Gregory on 01/29/20 by a video enabled telemedicine application and verified that I am speaking with the correct person using two identifiers.  Location patient: home Location provider:work office Persons participating in the virtual visit: patient, provider  I discussed the limitations of evaluation and management by telemedicine and the availability of in person appointments. The patient expressed understanding and agreed to proceed.  Chief Complaint  Patient presents with  . Sore Throat   HPI: Andrew Gregory is a 24 yo male c/o a day of sore throat, similar to when he was dx'ed with strep throat 2 years ago.  2 days ago he noticed some postnasal drainage makes him cough, clear mucus. He has not noted fever but has had some chills and body aches. Negative for nasal congestion, rhinorrhea, stridor, dysphagia, wheezing, dyspnea, abdominal pain, nausea, vomiting, changes in bowel habits, or a skin rash. + Enlarged tonsil and white "spots."  He has taken Tylenol. Negative for sick contacts or recent travel. He is not vaccinated against COVID-19. He has not noted anosmia or ageusia.  ROS: See pertinent positives and negatives per HPI.  Past Medical History:  Diagnosis Date  . Chicken pox   . Fainting spell   . Kawasaki disease (HCC) 2000    Past Surgical History:  Procedure Laterality Date  . immunoglobin transfusion  2000   following Kawasaki disease  . ORIF MANDIBULAR FRACTURE N/A 04/21/2019   Procedure: OPEN REDUCTION INTERNAL FIXATION (ORIF) MAXILLA AND MANDIBULAR FRACTURE WITH MMF SCREWS, Repair of Bilateral Ear Lacerations;  Surgeon: Peggye Form, DO;  Location: [facility] OR;  Service: Plastics;  Laterality: N/A;  . SHOULDER ARTHROSCOPY Left 04/26/2019   Procedure: ARTHROSCOPY SHOULDER WITH DEBRIDEMENT CORACOCLAVICULAR LIGAMENT RECONSTRUCTION;  Surgeon: Bjorn Pippin, MD;  Location: [facility] OR;  Service: Orthopedics;  Laterality: Left;     Family History  Problem Relation Age of Onset  . Cancer Maternal Grandmother 57       stomach cancer  . Hypertension Maternal Grandfather   . Heart disease Paternal Grandmother 31       stent placed  . Hyperlipidemia Paternal Grandfather   . Hypertension Paternal Grandfather     Social History   Socioeconomic History  . Marital status: Single    Spouse name: Not on file  . Number of children: Not on file  . Years of education: Not on file  . Highest education level: Not on file  Occupational History  . Not on file  Tobacco Use  . Smoking status: Never Smoker  . Smokeless tobacco: Never Used  Vaping Use  . Vaping Use: Former  Substance and Sexual Activity  . Alcohol use: Yes    Comment: rare to occasional  . Drug use: No  . Sexual activity: Not Currently  Other Topics Concern  . Not on file  Social History Narrative   ** Merged History Encounter **       ** Data from: 07/24/18 Enc Dept: [facility]   Daily exercise: wrestling, P90X, running   Diet: healthy       ** Data from: 04/21/19 Enc Dept: [facility]   Lives with parents.   Social Determinants of Health   Financial Resource Strain: Not on file  Food Insecurity: Not on file  Transportation Needs: Not on file  Physical Activity: Not on file  Stress: Not on file  Social Connections: Not on file  Intimate Partner Violence: Not on file    Current Outpatient Medications:  .  amoxicillin (AMOXIL) 500 MG capsule, Take 1 capsule (500 mg total) by mouth 2 (two) times daily for 10 days., Disp: 20 capsule, Rfl: 0  EXAM:  VITALS per patient if applicable:Ht 5' 11.5" (1.816 m)   BMI 19.12 kg/m   GENERAL: alert, oriented, appears well and in no acute distress  HEENT: atraumatic, conjunctiva clear, no obvious abnormalities on inspection of external nose and ears. Pharyngeal erythema, enlarged tonsils, and whitish layer of discharge on right tonsil. Uvula is centered and not edematous.  NECK: normal  movements of the head and neck. No enlarged glands but tenderness when palpating right submandibular gland.  LUNGS: on inspection no signs of respiratory distress, breathing rate appears normal, no obvious gross SOB, gasping or wheezing  CV: no obvious cyanosis  MS: moves all visible extremities without noticeable abnormality  PSYCH/NEURO: pleasant and cooperative, no obvious depression or anxiety, speech and thought processing grossly intact  ASSESSMENT AND PLAN:  Discussed the following assessment and plan:  Pharyngotonsillitis - Plan: amoxicillin (AMOXIL) 500 MG capsule  We discussed possible etiologies, including viral pharyngitis, in which case antibiotics will not help. We do not have strep test availability.  Based on history and observation during video, I am recommending empiric treatment for strep infection, amoxicillin 500 mg twice daily x10 days.  We discussed some side effects of antibiotic, instructed to complete treatment. Gargles with saline water may help. Continue Tylenol 500 mg 3-4 times per day as needed for pain.   I discussed the assessment and treatment plan with the patient.He was provided an opportunity to ask questions and all were answered. He agreed with the plan and demonstrated an understanding of the instructions.    Return if symptoms worsen or fail to improve.    Iretta Mangrum [place], MD

## 2020-04-21 ENCOUNTER — Other Ambulatory Visit: Payer: Self-pay

## 2020-04-21 ENCOUNTER — Ambulatory Visit: Payer: 59 | Admitting: Family Medicine

## 2020-04-21 VITALS — BP 128/78 | HR 67 | Temp 98.3°F | Ht 71.5 in | Wt 151.8 lb

## 2020-04-21 DIAGNOSIS — Z8739 Personal history of other diseases of the musculoskeletal system and connective tissue: Secondary | ICD-10-CM

## 2020-04-21 DIAGNOSIS — I4510 Unspecified right bundle-branch block: Secondary | ICD-10-CM | POA: Diagnosis not present

## 2020-04-21 DIAGNOSIS — I447 Left bundle-branch block, unspecified: Secondary | ICD-10-CM

## 2020-04-21 NOTE — Assessment & Plan Note (Addendum)
No further eval needed. He is very athletic, able to exercise 1-1.5 mile running 4 times a week.  He has no cardiac symptoms.  No further syncope ( past syncope deemed to be vasovagal).

## 2020-04-21 NOTE — Progress Notes (Signed)
Patient ID: Andrew Gregory, male    DOB: [birth year], 25 y.o.   MRN: [id number]  This visit was conducted in person.  BP 128/78   Pulse 67   Temp 98.3 F (36.8 C) (Temporal)   Ht 5' 11.5" (1.816 m)   Wt 151 lb 12 oz (68.8 kg)   SpO2 99%   BMI 20.87 kg/m    CC:  Chief Complaint  Patient presents with  . Medical Clearance    EKG needed for joining military     Subjective:   HPI: Andrew Gregory is a 25 y.o. male presenting on 04/21/2020 for Medical Clearance (EKG needed for joining [employer] ) [name] plans to join [employer].  Reviewed past notes in detail.  He has history of RBBB, incomplete  (noted in 2013 during workup for  syncopal event) and Kawasaki's disease (as a child, normal cardiac eval following showing no cardiac complications.). He has no CP, SOB, palpitations, no lightheadedness, no fatigue.    He was evaluated by [facility].. Dr. Mayer Camel several visits in 2013. Copied Dr. Noel Christmas comment on EKG below...   "Taji does have an abnormal EKG and manages a incomplete right bundle-branch block pattern. This can be seen about 5% of the normal population. At this time I would not recommend any further evaluation based on his EKG."  Of note our  Computer problem list was noted to  be incorrect. LBBB was listed incorrectly as opposed to  Correct diagnosis of RBBB, incomplete. Correction in problem list was made.   Relevant past medical, surgical, family and social history reviewed and updated as indicated. Interim medical history since our last visit reviewed. Allergies and medications reviewed and updated. No outpatient medications prior to visit.   No facility-administered medications prior to visit.    Patient Active Problem List   Diagnosis Date Noted  . Dribbling following urination 10/05/2019  . Mandible open fracture (HCC) 04/18/2019  . Open mandibular fracture (HCC) 04/18/2019  . Exposure to COVID-19 virus 07/24/2018  . Routine general medical  examination at a health care facility 09/23/2014  . Flank pain 07/28/2013  . Hx of Kawasaki's disease 07/31/2012  . Incomplete right bundle branch block (RBBB) 07/31/2012     Per HPI unless specifically indicated in ROS section below Review of Systems Objective:  BP 128/78   Pulse 67   Temp 98.3 F (36.8 C) (Temporal)   Ht 5' 11.5" (1.816 m)   Wt 151 lb 12 oz (68.8 kg)   SpO2 99%   BMI 20.87 kg/m   Wt Readings from Last 3 Encounters:  04/21/20 151 lb 12 oz (68.8 kg)  10/12/19 139 lb (63 kg)  10/05/19 137 lb 8 oz (62.4 kg)      Physical Exam Constitutional:      Appearance: He is well-developed.  HENT:     Head: Normocephalic.     Right Ear: Hearing normal.     Left Ear: Hearing normal.     Nose: Nose normal.  Neck:     Thyroid: No thyroid mass or thyromegaly.     Vascular: No carotid bruit.     Trachea: Trachea normal.  Cardiovascular:     Rate and Rhythm: Normal rate and regular rhythm.     Pulses: Normal pulses.     Heart sounds: Heart sounds not distant. No murmur heard. No friction rub. No gallop.      Comments: No peripheral edema Pulmonary:     Effort: Pulmonary effort is normal. No  respiratory distress.     Breath sounds: Normal breath sounds.  Skin:    General: Skin is warm and dry.     Findings: No rash.  Psychiatric:        Speech: Speech normal.        Behavior: Behavior normal.        Thought Content: Thought content normal.       Results for orders placed or performed in visit on 10/05/19  C. trachomatis/N. gonorrhoeae RNA   Specimen: Urine  Result Value Ref Range   C. trachomatis RNA, TMA NOT DETECTED NOT DETECT   N. gonorrhoeae RNA, TMA NOT DETECTED NOT DETECT  POCT Urinalysis Dipstick (Automated)  Result Value Ref Range   Color, UA Yellow    Clarity, UA Clear    Glucose, UA Negative Negative   Bilirubin, UA Negative    Ketones, UA Negative    Spec Grav, UA 1.015 1.010 - 1.025   Blood, UA Negative    pH, UA 7.5 5.0 - 8.0    Protein, UA Negative Negative   Urobilinogen, UA 0.2 0.2 or 1.0 E.U./dL   Nitrite, UA Negative    Leukocytes, UA Negative Negative    This visit occurred during the SARS-CoV-2 public health emergency.  Safety protocols were in place, including screening questions prior to the visit, additional usage of staff PPE, and extensive cleaning of exam room while observing appropriate contact time as indicated for disinfecting solutions.   COVID 19 screen:  No recent travel or known exposure to COVID19 The patient denies respiratory symptoms of COVID 19 at this time. The importance of social distancing was discussed today.   Assessment and Plan    Problem List Items Addressed This Visit    Hx of Kawasaki's disease    Past ECHOs showed no cardiac complications from Kawasaki's disease.      Incomplete right bundle branch block (RBBB) - Primary     No further eval needed. He is very athletic, able to exercise 1-1.5 mile running 4 times a week.  He has no cardiac symptoms.  No further syncope ( past syncope deemed to be vasovagal).          [name], MD

## 2020-04-21 NOTE — Assessment & Plan Note (Signed)
Past ECHOs showed no cardiac complications from Kawasaki's disease.

## 2020-04-24 NOTE — Telephone Encounter (Signed)
Letter written and sent to Dr. Ermalene Searing for review/edit.

## 2020-07-11 ENCOUNTER — Other Ambulatory Visit: Payer: Self-pay

## 2020-07-11 ENCOUNTER — Encounter: Payer: Self-pay | Admitting: Family Medicine

## 2020-07-11 ENCOUNTER — Ambulatory Visit: Payer: 59 | Admitting: Family Medicine

## 2020-07-11 VITALS — BP 130/76 | HR 71 | Temp 98.4°F | Ht 71.5 in | Wt 155.0 lb

## 2020-07-11 DIAGNOSIS — I878 Other specified disorders of veins: Secondary | ICD-10-CM | POA: Diagnosis not present

## 2020-07-11 NOTE — Progress Notes (Signed)
Patient ID: Andrew Gregory, male    DOB: 16-Dec-1995, 25 y.o.   MRN: [id number]  This visit was conducted in person.  BP (!) 130/26   Pulse 71   Temp 98.4 F (36.9 C) (Temporal)   Ht 5' 11.5" (1.816 m)   Wt 155 lb (70.3 kg)   SpO2 96%   BMI 21.32 kg/m    CC:  Chief Complaint  Patient presents with  . Lumps in forearms    Subjective:   HPI: Andrew Gregory is a 25 y.o. male presenting on 07/11/2020 for Lumps in forearms   He has noted bumps/ protruding areas in vessels on left forearm... notes with exercise and when warmer. He has noted it in last year... lumps come and go,  Started left forearm and now sees one on right forearm.  No pain, able to move the vessel    No lightheadedness.. he very active.. lifting weights 3 times week.  BP Readings from Last 3 Encounters:  07/11/20 (!) 130/26  04/21/20 128/78  10/12/19 126/80        Relevant past medical, surgical, family and social history reviewed and updated as indicated. Interim medical history since our last visit reviewed. Allergies and medications reviewed and updated. No outpatient medications prior to visit.   No facility-administered medications prior to visit.     Per HPI unless specifically indicated in ROS section below Review of Systems  Constitutional: Negative for fatigue and fever.  HENT: Negative for ear pain.   Eyes: Negative for pain.  Respiratory: Negative for cough and shortness of breath.   Cardiovascular: Negative for chest pain, palpitations and leg swelling.  Gastrointestinal: Negative for abdominal pain.  Genitourinary: Negative for dysuria.  Musculoskeletal: Negative for arthralgias.  Neurological: Negative for syncope, light-headedness and headaches.  Psychiatric/Behavioral: Negative for dysphoric mood.   Objective:  BP (!) 130/26   Pulse 71   Temp 98.4 F (36.9 C) (Temporal)   Ht 5' 11.5" (1.816 m)   Wt 155 lb (70.3 kg)   SpO2 96%   BMI 21.32 kg/m   Wt Readings from Last 3  Encounters:  07/11/20 155 lb (70.3 kg)  04/21/20 151 lb 12 oz (68.8 kg)  10/12/19 139 lb (63 kg)      Physical Exam Constitutional:      Appearance: He is well-developed.  HENT:     Head: Normocephalic.     Right Ear: Hearing normal.     Left Ear: Hearing normal.     Nose: Nose normal.  Neck:     Thyroid: No thyroid mass or thyromegaly.     Vascular: No carotid bruit.     Trachea: Trachea normal.  Cardiovascular:     Rate and Rhythm: Normal rate and regular rhythm.     Pulses: Normal pulses.     Heart sounds: Heart sounds not distant. No murmur heard. No friction rub. No gallop.      Comments: No peripheral edema Pulmonary:     Effort: Pulmonary effort is normal. No respiratory distress.     Breath sounds: Normal breath sounds.  Skin:    General: Skin is warm and dry.     Findings: No rash.          Comments:  Slightly prominent veins in bilateral forearms  -no masses, no arm swelling, no masses in axillae  -blood pressures equal in both arms. - minimal change with compressing radial  and antecubital veins  Psychiatric:  Speech: Speech normal.        Behavior: Behavior normal.        Thought Content: Thought content normal.       Results for orders placed or performed in visit on 10/05/19  C. trachomatis/N. gonorrhoeae RNA   Specimen: Urine  Result Value Ref Range   C. trachomatis RNA, TMA NOT DETECTED NOT DETECT   N. gonorrhoeae RNA, TMA NOT DETECTED NOT DETECT  POCT Urinalysis Dipstick (Automated)  Result Value Ref Range   Color, UA Yellow    Clarity, UA Clear    Glucose, UA Negative Negative   Bilirubin, UA Negative    Ketones, UA Negative    Spec Grav, UA 1.015 1.010 - 1.025   Blood, UA Negative    pH, UA 7.5 5.0 - 8.0   Protein, UA Negative Negative   Urobilinogen, UA 0.2 0.2 or 1.0 E.U./dL   Nitrite, UA Negative    Leukocytes, UA Negative Negative    This visit occurred during the SARS-CoV-2 public health emergency.  Safety protocols were  in place, including screening questions prior to the visit, additional usage of staff PPE, and extensive cleaning of exam room while observing appropriate contact time as indicated for disinfecting solutions.   COVID 19 screen:  No recent travel or known exposure to COVID19 The patient denies respiratory symptoms of COVID 19 at this time. The importance of social distancing was discussed today.   Assessment and Plan    Problem List Items Addressed This Visit    Other specified disorders of veins - Primary     Likely prominent but health veins due to upper body weight lifting  no masses  no pain No varicosities in legs.  no peripheral edema/ lymphadenopathy  Pt reassured. If new pain or swelling consider vascular referral.          [name], MD

## 2020-07-11 NOTE — Assessment & Plan Note (Signed)
Likely prominent but health veins due to upper body weight lifting  no masses  no pain No varicosities in legs.  no peripheral edema/ lymphadenopathy  Pt reassured. If new pain or swelling consider vascular referral.

## 2020-07-11 NOTE — Patient Instructions (Signed)
Call for vascular referral if swelling in arms,or pain in veins in area of prominence.

## 2020-08-09 ENCOUNTER — Other Ambulatory Visit: Payer: Self-pay | Admitting: Family Medicine

## 2020-08-09 ENCOUNTER — Telehealth: Payer: Self-pay | Admitting: Family Medicine

## 2020-08-09 ENCOUNTER — Telehealth: Payer: Self-pay

## 2020-08-09 DIAGNOSIS — Z8739 Personal history of other diseases of the musculoskeletal system and connective tissue: Secondary | ICD-10-CM

## 2020-08-09 DIAGNOSIS — I4510 Unspecified right bundle-branch block: Secondary | ICD-10-CM

## 2020-08-09 NOTE — Telephone Encounter (Signed)
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

## 2020-08-09 NOTE — Telephone Encounter (Signed)
Patient advised.

## 2020-08-09 NOTE — Telephone Encounter (Signed)
Left message for the patient on both numbers to call me back to discuss cardiology appointment.  Appointment made for 08/17/20 at 10:40 am with Debbe Odea, MD Address: [facility] in [place] [address] [place] Phone: [phone]  Arrive 15 minutes earlier for check in. If patient needs to reschedule he can call their office directly. This appointment is a consultation visit to discuss stress test that he needs. Need to advise patient.

## 2020-08-09 NOTE — Telephone Encounter (Signed)
Left message for Andrew Gregory that Dr. Ermalene Searing has placed a referral for Cardiology so they can set up his stress test.  I advised they will be calling him with that appointment.

## 2020-08-17 ENCOUNTER — Other Ambulatory Visit: Payer: Self-pay | Admitting: Cardiology

## 2020-08-17 ENCOUNTER — Encounter: Payer: Self-pay | Admitting: Cardiology

## 2020-08-17 ENCOUNTER — Ambulatory Visit: Payer: 59 | Admitting: Cardiology

## 2020-08-17 ENCOUNTER — Other Ambulatory Visit: Payer: Self-pay

## 2020-08-17 VITALS — BP 120/72 | HR 66 | Ht 71.0 in | Wt 157.0 lb

## 2020-08-17 DIAGNOSIS — I4510 Unspecified right bundle-branch block: Secondary | ICD-10-CM

## 2020-08-17 DIAGNOSIS — Z0289 Encounter for other administrative examinations: Secondary | ICD-10-CM

## 2020-08-17 DIAGNOSIS — R9431 Abnormal electrocardiogram [ECG] [EKG]: Secondary | ICD-10-CM

## 2020-08-17 NOTE — Progress Notes (Signed)
Cardiology Office Note:    Date:  08/17/2020   ID:  Rosalene Billings, DOB 1995-03-05, MRN [id number]  PCP:  Excell Seltzer, MD   [facility] Providers Cardiologist:  None     Referring MD: Excell Seltzer, MD   Chief Complaint  Patient presents with   New Patient (Initial Visit)    Referrerd by PCP for Incomplete Right bundle branch block. Patient needs to have a stress test to send to the [facility] to be able to get in. Meds reviewed verbally with patient.    Andrew Gregory is a 25 y.o. male who is being seen today for the evaluation of abnormal ECG at the request of Ermalene Searing, Luberta Robertson, MD.   History of Present Illness:    Andrew Gregory is a 25 y.o. male with no significant past medical history who presents due to abnormal EKG and also cardiac exam prior to joining the [employer].  He was seen for regular physical with primary care physician where EKG showed right bundle branch block.  He denies chest pain, shortness of breath.  He is planning on joining the [employer].  Upon noticing abnormal EKG, may be required stress test to evaluate any cardiac abnormalities.  Patient denies chest pain, shortness of breath, edema, palpitations, dizziness.  He feels well, has no concerns at this time.  Past Medical History:  Diagnosis Date   Chicken pox    Fainting spell    Kawasaki disease (HCC) 2000    Past Surgical History:  Procedure Laterality Date   immunoglobin transfusion  2000   following Kawasaki disease   ORIF MANDIBULAR FRACTURE N/A 04/21/2019   Procedure: OPEN REDUCTION INTERNAL FIXATION (ORIF) MAXILLA AND MANDIBULAR FRACTURE WITH MMF SCREWS, Repair of Bilateral Ear Lacerations;  Surgeon: Peggye Form, DO;  Location: [facility] OR;  Service: Plastics;  Laterality: N/A;   SHOULDER ARTHROSCOPY Left 04/26/2019   Procedure: ARTHROSCOPY SHOULDER WITH DEBRIDEMENT CORACOCLAVICULAR LIGAMENT RECONSTRUCTION;  Surgeon: Bjorn Pippin, MD;  Location: [facility] OR;  Service: Orthopedics;  Laterality: Left;    Current  Medications: No outpatient medications have been marked as taking for the 08/17/20 encounter (Office Visit) with Debbe Odea, MD.     Allergies:   Patient has no known allergies.   Social History   Socioeconomic History   Marital status: Single    Spouse name: Not on file   Number of children: Not on file   Years of education: Not on file   Highest education level: Not on file  Occupational History   Not on file  Tobacco Use   Smoking status: Never   Smokeless tobacco: Never  Vaping Use   Vaping Use: Former  Substance and Sexual Activity   Alcohol use: Yes    Comment: rare to occasional   Drug use: No   Sexual activity: Not Currently  Other Topics Concern   Not on file  Social History Narrative   ** Merged History Encounter **       ** Data from: 07/24/18 Enc Dept: [facility]   Daily exercise: wrestling, P90X, running   Diet: healthy       ** Data from: 04/21/19 Enc Dept: [facility]   Lives with parents.   Social Determinants of Health   Financial Resource Strain: Not on file  Food Insecurity: Not on file  Transportation Needs: Not on file  Physical Activity: Not on file  Stress: Not on file  Social Connections: Not on file     Family History: The patient's  family history includes Cancer (age of onset: 59) in his maternal grandmother; Heart disease (age of onset: 34) in his paternal grandmother; Hyperlipidemia in his paternal grandfather; Hypertension in his maternal grandfather and paternal grandfather.  ROS:   Please see the history of present illness.     All other systems reviewed and are negative.  EKGs/Labs/Other Studies Reviewed:    The following studies were reviewed today:   EKG:  EKG is  ordered today.  The ekg ordered today demonstrates sinus rhythm, left anterior hemiblock.  Recent Labs: No results found for requested labs within last 8760 hours.  Recent Lipid Panel    Component Value Date/Time   CHOL 159 11/22/2011 0000   TRIG  46 11/22/2011 0000   HDL 65 11/22/2011 0000   LDLCALC 85 11/22/2011 0000     Risk Assessment/Calculations:          Physical Exam:    VS:  BP 120/72 (BP Location: Left Arm, Patient Position: Sitting, Cuff Size: Normal)   Pulse 66   Ht 5\' 11"  (1.803 m)   Wt 157 lb (71.2 kg)   SpO2 98%   BMI 21.90 kg/m     Wt Readings from Last 3 Encounters:  08/17/20 157 lb (71.2 kg)  07/11/20 155 lb (70.3 kg)  04/21/20 151 lb 12 oz (68.8 kg)     GEN:  Well nourished, well developed in no acute distress HEENT: Normal NECK: No JVD; No carotid bruits LYMPHATICS: No lymphadenopathy CARDIAC: RRR, no murmurs, rubs, gallops RESPIRATORY:  Clear to auscultation without rales, wheezing or rhonchi  ABDOMEN: Soft, non-tender, non-distended MUSCULOSKELETAL:  No edema; No deformity  SKIN: Warm and dry NEUROLOGIC:  Alert and oriented x 3 PSYCHIATRIC:  Normal affect   ASSESSMENT:    1. Nonspecific abnormal electrocardiogram (ECG) (EKG)   2. History and physical examination, occupation    PLAN:    In order of problems listed above:  EKG at PCP showed right bundle branch block, EKG today showing left anterior hemiblock, no other significant findings.  Denies chest pain, shortness of breath, syncope.  Due to occupational risk, obtain echo to evaluate systolic and diastolic function. Patient is planning on joining 06/21/20, stress test required.  Denies chest pain.  Plan for exercise treadmill testing as soon as possible.  Hopefully next week.  Follow-up as needed if abnormal test results.  Plan to call patient with test results.   Shared Decision Making/Informed Consent The risks [chest pain, shortness of breath, cardiac arrhythmias, dizziness, blood pressure fluctuations, myocardial infarction, stroke/transient ischemic attack, and life-threatening complications (estimated to be 1 in 10,000)], benefits (risk stratification, diagnosing coronary artery disease, treatment guidance) and alternatives of  an exercise tolerance test were discussed in detail with Andrew Gregory and he agrees to proceed.    Medication Adjustments/Labs and Tests Ordered: Current medicines are reviewed at length with the patient today.  Concerns regarding medicines are outlined above.  Orders Placed This Encounter  Procedures   Exercise Tolerance Test   EKG 12-Lead   ECHOCARDIOGRAM COMPLETE    No orders of the defined types were placed in this encounter.   Patient Instructions  Medication Instructions:  Your physician recommends that you continue on your current medications as directed. Please refer to the Current Medication list given to you today.  *If you need a refill on your cardiac medications before your next appointment, please call your pharmacy*   Lab Work: None ordered If you have labs (blood work) drawn today and your tests  are completely normal, you will receive your results only by: MyChart Message (if you have MyChart) OR A paper copy in the mail If you have any lab test that is abnormal or we need to change your treatment, we will call you to review the results.   Testing/Procedures:   Your physician has requested that you have an echocardiogram. Echocardiography is a painless test that uses sound waves to create images of your heart. It provides your doctor with information about the size and shape of your heart and how well your heart's chambers and valves are working. This procedure takes approximately one hour. There are no restrictions for this procedure.   2.  Stress Test Treadmill    - you may eat a light breakfast/ lunch prior to your procedure - no caffeine for 24 hours prior to your test (coffee, tea, soft drinks, or chocolate)  - no smoking/ vaping for 4 hours prior to your test - bring any inhalers with you to your test - wear comfortable clothing & tennis/ non-skid shoes to walk on the treadmill    Follow-Up: At [facility], you and your health needs are our priority.   As part of our continuing mission to provide you with exceptional heart care, we have created designated Provider Care Teams.  These Care Teams include your primary Cardiologist (physician) and Advanced Practice Providers (APPs -  Physician Assistants and Nurse Practitioners) who all work together to provide you with the care you need, when you need it.  We recommend signing up for the patient portal called "MyChart".  Sign up information is provided on this After Visit Summary.  MyChart is used to connect with patients for Virtual Visits (Telemedicine).  Patients are able to view lab/test results, encounter notes, upcoming appointments, etc.  Non-urgent messages can be sent to your provider as well.   To learn more about what you can do with MyChart, go to [email or web address].    Your next appointment:   Follow up as needed   The format for your next appointment:   In Person  Provider:   Debbe Odea, MD   Other Instructions    Signed, Debbe Odea, MD  08/17/2020 11:37 AM    [facility]

## 2020-08-17 NOTE — Patient Instructions (Signed)
Medication Instructions:  Your physician recommends that you continue on your current medications as directed. Please refer to the Current Medication list given to you today.  *If you need a refill on your cardiac medications before your next appointment, please call your pharmacy*   Lab Work: None ordered If you have labs (blood work) drawn today and your tests are completely normal, you will receive your results only by: MyChart Message (if you have MyChart) OR A paper copy in the mail If you have any lab test that is abnormal or we need to change your treatment, we will call you to review the results.   Testing/Procedures:   Your physician has requested that you have an echocardiogram. Echocardiography is a painless test that uses sound waves to create images of your heart. It provides your doctor with information about the size and shape of your heart and how well your heart's chambers and valves are working. This procedure takes approximately one hour. There are no restrictions for this procedure.   2.  Stress Test Treadmill    - you may eat a light breakfast/ lunch prior to your procedure - no caffeine for 24 hours prior to your test (coffee, tea, soft drinks, or chocolate)  - no smoking/ vaping for 4 hours prior to your test - bring any inhalers with you to your test - wear comfortable clothing & tennis/ non-skid shoes to walk on the treadmill    Follow-Up: At [facility], you and your health needs are our priority.  As part of our continuing mission to provide you with exceptional heart care, we have created designated Provider Care Teams.  These Care Teams include your primary Cardiologist (physician) and Advanced Practice Providers (APPs -  Physician Assistants and Nurse Practitioners) who all work together to provide you with the care you need, when you need it.  We recommend signing up for the patient portal called "MyChart".  Sign up information is provided on this  After Visit Summary.  MyChart is used to connect with patients for Virtual Visits (Telemedicine).  Patients are able to view lab/test results, encounter notes, upcoming appointments, etc.  Non-urgent messages can be sent to your provider as well.   To learn more about what you can do with MyChart, go to [email or web address].    Your next appointment:   Follow up as needed   The format for your next appointment:   In Person  Provider:   Debbe Odea, MD   Other Instructions

## 2020-08-21 ENCOUNTER — Ambulatory Visit (INDEPENDENT_AMBULATORY_CARE_PROVIDER_SITE_OTHER): Payer: 59

## 2020-08-21 ENCOUNTER — Other Ambulatory Visit: Payer: Self-pay

## 2020-08-21 DIAGNOSIS — I4510 Unspecified right bundle-branch block: Secondary | ICD-10-CM | POA: Diagnosis not present

## 2020-08-21 DIAGNOSIS — R9431 Abnormal electrocardiogram [ECG] [EKG]: Secondary | ICD-10-CM | POA: Diagnosis not present

## 2020-08-21 LAB — ECHOCARDIOGRAM COMPLETE
Area-P 1/2: 2.91 cm2
Calc EF: 65.3 %
S' Lateral: 3.9 cm
Single Plane A2C EF: 68.9 %
Single Plane A4C EF: 55.3 %

## 2020-08-24 ENCOUNTER — Ambulatory Visit (INDEPENDENT_AMBULATORY_CARE_PROVIDER_SITE_OTHER): Payer: 59

## 2020-08-24 ENCOUNTER — Other Ambulatory Visit: Payer: Self-pay

## 2020-08-24 ENCOUNTER — Other Ambulatory Visit: Payer: Self-pay | Admitting: *Deleted

## 2020-08-24 DIAGNOSIS — R9431 Abnormal electrocardiogram [ECG] [EKG]: Secondary | ICD-10-CM

## 2020-08-24 LAB — EXERCISE TOLERANCE TEST
Estimated workload: 19.5 METS
Exercise duration (min): 16 min
Exercise duration (sec): 30 s
MPHR: 196 {beats}/min
Peak HR: 187 {beats}/min
Percent HR: 95 %
RPE: 17
Rest HR: 64 {beats}/min

## 2020-08-24 NOTE — Progress Notes (Signed)
Add the Nurse attestation for patient's ETT and it was signed by Dr. Azucena Cecil.

## 2020-08-24 NOTE — Addendum Note (Signed)
Addended by: Gibson Ramp on: 08/24/2020 08:57 AM   Modules accepted: Orders

## 2020-08-25 ENCOUNTER — Telehealth: Payer: Self-pay

## 2020-08-25 NOTE — Telephone Encounter (Signed)
-----   Message from Debbe Odea, MD sent at 08/24/2020  5:50 PM EDT ----- Echocardiogram shows normal systolic and diastolic function, no gross structural abnormalities noted.

## 2020-08-25 NOTE — Telephone Encounter (Signed)
The patient has been notified of the result and verbalized understanding.  All questions (if any) were answered. 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

## 2020-08-25 NOTE — Telephone Encounter (Signed)
Called patient to give him his ETT and Echo results. Left message on his VM requesting a call back.  Please advise patient an exercise stress test was normal.

## 2020-09-01 NOTE — Addendum Note (Signed)
Addended by: Debbe Odea on: 09/01/2020 11:18 AM   Modules accepted: Orders

## 2021-09-21 IMAGING — RF DG MANDIBLE 1-3V
1 series · 1 of 1 positions shown · non-contrast
Comparison: CT from 04/18/2019

CLINICAL DATA: Known mid mandibular fracture

EXAM:
MANDIBLE - 1-3 VIEW; DG C-ARM 1-60 MIN

[Series 1: run · 1 of 1 slices shown]
[im 1/1]
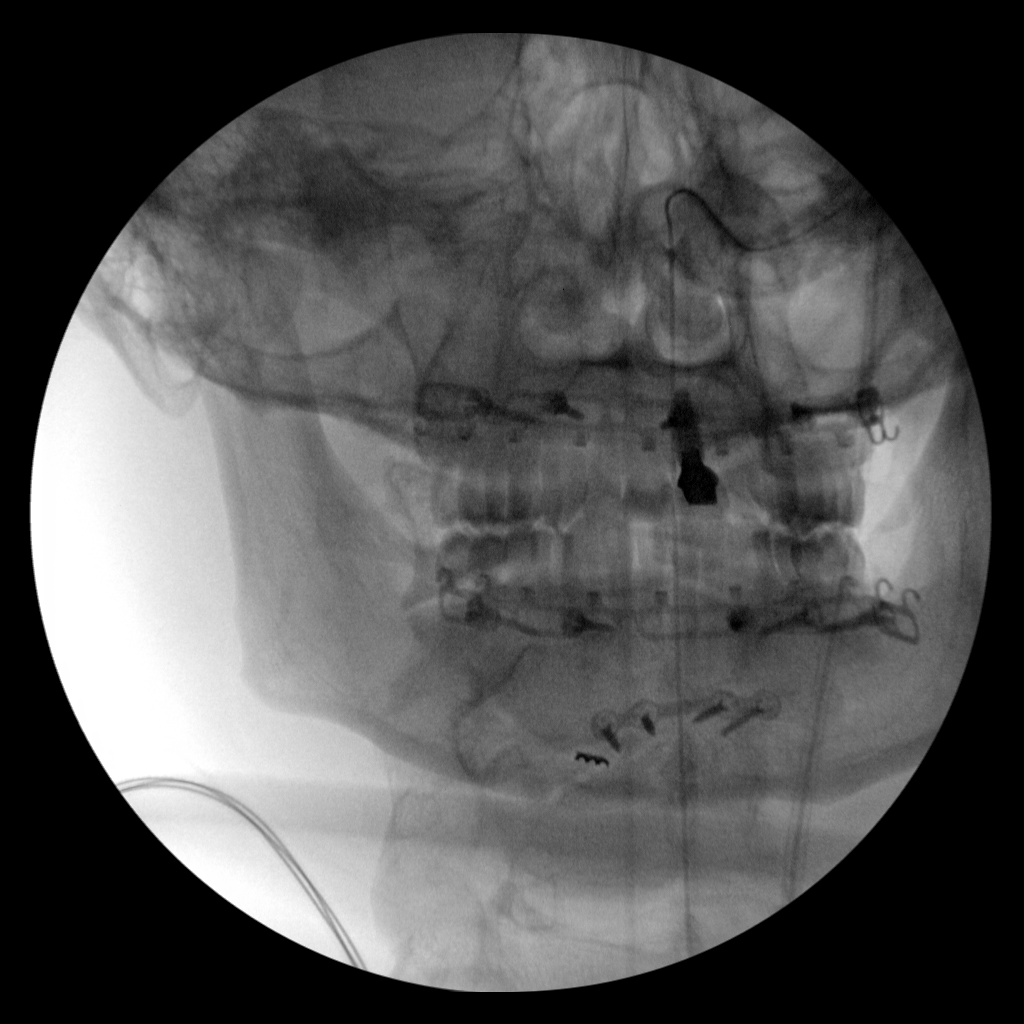

[1 of 1 positions shown; findings below may reference images not displayed]

FLUOROSCOPY TIME:  Fluoroscopy Time:  3 seconds

Radiation Exposure Index (if provided by the fluoroscopic device):
Not available

Number of Acquired Spot Images: 1
FINDINGS: Fixation plate is noted with multiple fixation screws in the
midportion of the mandible. Fracture fragments are in near anatomic
alignment.
IMPRESSION: ORIF of mandibular fracture.
